# Patient Record
Sex: Female | Born: 1990 | Race: White | Hispanic: No | Marital: Married | Smoking: Never smoker
Health system: Southern US, Community
[De-identification: ages and names within clinical notes are randomized; demographics above are authoritative.]

## PROBLEM LIST (undated history)

## (undated) ENCOUNTER — Ambulatory Visit: Admission: EM | Disposition: A | Discharge status: Home / Self Care | Payer: 59

## (undated) DIAGNOSIS — F419 Anxiety disorder, unspecified: Secondary | ICD-10-CM

## (undated) DIAGNOSIS — R002 Palpitations: Secondary | ICD-10-CM

## (undated) HISTORY — DX: Anxiety disorder, unspecified: F41.9

## (undated) HISTORY — PX: TONSILLECTOMY: SUR1361

## (undated) HISTORY — DX: Palpitations: R00.2

## (undated) HISTORY — PX: BREAST SURGERY: SHX581

---

## 2010-04-05 DIAGNOSIS — I4949 Other premature depolarization: Secondary | ICD-10-CM | POA: Insufficient documentation

## 2018-11-05 ENCOUNTER — Other Ambulatory Visit (HOSPITAL_COMMUNITY)
Admission: RE | Admit: 2018-11-05 | Discharge: 2018-11-05 | Disposition: A | Discharge status: Home / Self Care | Payer: 59 | Source: Ambulatory Visit | Attending: Obstetrics and Gynecology | Admitting: Obstetrics and Gynecology

## 2018-11-05 ENCOUNTER — Ambulatory Visit (INDEPENDENT_AMBULATORY_CARE_PROVIDER_SITE_OTHER): Payer: Self-pay | Admitting: Obstetrics and Gynecology

## 2018-11-05 ENCOUNTER — Other Ambulatory Visit: Payer: Self-pay

## 2018-11-05 ENCOUNTER — Encounter: Payer: Self-pay | Admitting: Obstetrics and Gynecology

## 2018-11-05 VITALS — BP 125/71 | HR 65 | Ht 64.0 in | Wt 187.6 lb

## 2018-11-05 DIAGNOSIS — Z124 Encounter for screening for malignant neoplasm of cervix: Secondary | ICD-10-CM

## 2018-11-05 DIAGNOSIS — Z01419 Encounter for gynecological examination (general) (routine) without abnormal findings: Secondary | ICD-10-CM

## 2018-11-05 NOTE — Addendum Note (Signed)
Addended by: Durwin Glaze on: 11/05/2018 01:59 PM   Modules accepted: Orders

## 2018-11-05 NOTE — Progress Notes (Signed)
Patient comes in today as a new patient. She is here for a physical.

## 2018-11-05 NOTE — Progress Notes (Signed)
HPI:      [name] is a 28 y.o. G0P0000 who LMP was Patient's last menstrual period was 10/21/2018.  Subjective:   She presents today for her annual examination.  She has no complaints.  She uses Nexplanon for birth control.  She has occasional menses. She is considering pregnancy next year and would like her Nexplanon removed in December.  This is her third Nexplanon.    Hx: The following portions of the patient's history were reviewed and updated as appropriate:             She  has a past medical history of Anxiety. She does not have a problem list on file. She  has a past surgical history that includes Tonsillectomy and Breast surgery. Her family history includes COPD in her maternal grandmother; Heart disease in her mother; Heart failure in her father. She  reports that she has never smoked. She has never used smokeless tobacco. She reports that she does not use drugs. No history on file for alcohol. She currently has no medications in their medication list. She is allergic to azithromycin and penicillins.       Review of Systems:  Review of Systems  Constitutional: Denied constitutional symptoms, night sweats, recent illness, fatigue, fever, insomnia and weight loss.  Eyes: Denied eye symptoms, eye pain, photophobia, vision change and visual disturbance.  Ears/Nose/Throat/Neck: Denied ear, nose, throat or neck symptoms, hearing loss, nasal discharge, sinus congestion and sore throat.  Cardiovascular: Denied cardiovascular symptoms, arrhythmia, chest pain/pressure, edema, exercise intolerance, orthopnea and palpitations.  Respiratory: Denied pulmonary symptoms, asthma, pleuritic pain, productive sputum, cough, dyspnea and wheezing.  Gastrointestinal: Denied, gastro-esophageal reflux, melena, nausea and vomiting.  Genitourinary: Denied genitourinary symptoms including symptomatic vaginal discharge, pelvic relaxation issues, and urinary complaints.  Musculoskeletal: Denied  musculoskeletal symptoms, stiffness, swelling, muscle weakness and myalgia.  Dermatologic: Denied dermatology symptoms, rash and scar.  Neurologic: Denied neurology symptoms, dizziness, headache, neck pain and syncope.  Psychiatric: Denied psychiatric symptoms, anxiety and depression.  Endocrine: Denied endocrine symptoms including hot flashes and night sweats.   Meds:   No current outpatient medications on file prior to visit.   No current facility-administered medications on file prior to visit.     Objective:     Vitals:   11/05/18 1327  BP: 125/71  Pulse: 65              Physical examination General NAD, Conversant  HEENT Atraumatic; Op clear with mmm.  Normo-cephalic. Pupils reactive. Anicteric sclerae  Thyroid/Neck Smooth without nodularity or enlargement. Normal ROM.  Neck Supple.  Skin No rashes, lesions or ulceration. Normal palpated skin turgor. No nodularity.  Breasts: No masses or discharge.  Symmetric.  No axillary adenopathy.  Lungs: Clear to auscultation.No rales or wheezes. Normal Respiratory effort, no retractions.  Heart: NSR.  No murmurs or rubs appreciated. No periferal edema  Abdomen: Soft.  Non-tender.  No masses.  No HSM. No hernia  Extremities: Moves all appropriately.  Normal ROM for age. No lymphadenopathy.  Neuro: Oriented to PPT.  Normal mood. Normal affect.     Pelvic:   Vulva: Normal appearance.  No lesions.  Vagina: No lesions or abnormalities noted.  Support: Normal pelvic support.  Urethra No masses tenderness or scarring.  Meatus Normal size without lesions or prolapse.  Cervix: Normal appearance.  No lesions.  Anus: Normal exam.  No lesions.  Perineum: Normal exam.  No lesions.        Bimanual   Uterus:  Normal size.  Non-tender.  Mobile.  AV.  Adnexae: No masses.  Non-tender to palpation.  Cul-de-sac: Negative for abnormality.      Assessment:    G0P0000 There are no active problems to display for this patient.    1. Well  woman exam with routine gynecological exam     No problems.   Plan:            1.  Basic Screening Recommendations The basic screening recommendations for asymptomatic women were discussed with the patient during her visit.  The age-appropriate recommendations were discussed with her and the rational for the tests reviewed.  When I am informed by the patient that another primary care physician has previously obtained the age-appropriate tests and they are up-to-date, only outstanding tests are ordered and referrals given as necessary.  Abnormal results of tests will be discussed with her when all of her results are completed. Pap performed 2.  Follow-up in December for Nexplanon removal Orders No orders of the defined types were placed in this encounter.   No orders of the defined types were placed in this encounter.       F/U  Return in about 1 year (around 11/05/2019) for Annual Physical.  Finis Bud, M.D. 11/05/2018 1:53 PM

## 2018-11-07 LAB — CYTOLOGY - PAP: Diagnosis: NEGATIVE

## 2018-12-26 ENCOUNTER — Telehealth: Payer: Self-pay | Admitting: General Practice

## 2018-12-26 NOTE — Telephone Encounter (Signed)
Patient is calling to establish care and get a TB- for school surgical technology [phone]

## 2018-12-30 ENCOUNTER — Other Ambulatory Visit: Payer: Self-pay

## 2018-12-30 ENCOUNTER — Encounter: Payer: Self-pay | Admitting: Primary Care

## 2018-12-30 ENCOUNTER — Ambulatory Visit (INDEPENDENT_AMBULATORY_CARE_PROVIDER_SITE_OTHER): Payer: 59 | Admitting: Primary Care

## 2018-12-30 VITALS — BP 122/82 | HR 61 | Temp 98.3°F | Ht 64.0 in | Wt 189.5 lb

## 2018-12-30 DIAGNOSIS — Z111 Encounter for screening for respiratory tuberculosis: Secondary | ICD-10-CM | POA: Diagnosis not present

## 2018-12-30 DIAGNOSIS — R002 Palpitations: Secondary | ICD-10-CM

## 2018-12-30 DIAGNOSIS — F411 Generalized anxiety disorder: Secondary | ICD-10-CM | POA: Insufficient documentation

## 2018-12-30 NOTE — Addendum Note (Signed)
Addended by: Jacqualin Combes on: 12/30/2018 08:40 AM   Modules accepted: Orders

## 2018-12-30 NOTE — Assessment & Plan Note (Signed)
Chronic for years, once on Zoloft but has been off for several years. Doing well overall. Continue to monitor.

## 2018-12-30 NOTE — Progress Notes (Signed)
Subjective:    Patient ID: Veronica Wagner, female    DOB: 08/19/1990, 28 y.o.   MRN: [id number]  HPI  Veronica Wagner is a 28 year old female who presents today to establish care and discuss the problems mentioned below. Will obtain/review records.  1) Anxiety: Chronic, diagnosed around 2015. Once managed on sertraline, weaned herself off of her medication in 2018. Currently she's managing her anxiety with exercise and other self calming techniques.   2) Palpitations: Chronic. Evaluated by cardiology, has worn three different heart monitors without any obvious cause. Palpitations occur mostly when driving, several times weekly.  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 She is needing a TB skin test today. She denies cough, night sweats, unexplained weight loss, hemoptysis.   Review of Systems  Constitutional: Negative for fatigue and unexpected weight change.  Respiratory: Negative for shortness of breath.        Denies hemoptysis   Cardiovascular: Negative for chest pain.  Neurological: Negative for headaches.       Past Medical History:  Diagnosis Date  . Anxiety    Not on medication  . Palpitations      Social History   Socioeconomic History  . Marital status: Married    Spouse name: Not on file  . Number of children: Not on file  . Years of education: Not on file  . Highest education level: Not on file  Occupational History  . Not on file  Social Needs  . Financial resource strain: Not on file  . Food insecurity    Worry: Not on file    Inability: Not on file  . Transportation needs    Medical: Not on file    Non-medical: Not on file  Tobacco Use  . Smoking status: Never Smoker  . Smokeless tobacco: Never Used  Substance and Sexual Activity  . Alcohol use: Not on file    Comment: social   . Drug use: Never  . Sexual activity: Yes    Birth control/protection: Implant  Lifestyle  . Physical activity    Days per  week: Not on file    Minutes per session: Not on file  . Stress: Not on file  Relationships  . Social Musicianconnections    Talks on phone: Not on file    Gets together: Not on file    Attends religious service: Not on file    Active member of club or organization: Not on file    Attends meetings of clubs or organizations: Not on file    Relationship status: Not on file  . Intimate partner violence    Fear of current or ex partner: Not on file    Emotionally abused: Not on file    Physically abused: Not on file    Forced sexual activity: Not on file  Other Topics Concern  . Not on file  Social History Narrative   Married.   Student at [employer].    Past Surgical History:  Procedure Laterality Date  . BREAST SURGERY     Left breast aspiration.   . TONSILLECTOMY      Family History  Problem Relation Age of Onset  . Heart disease Mother   . Heart attack Father 1545  . Early death Father   . COPD Maternal Grandmother   . Dementia Maternal Grandfather     Allergies  Allergen Reactions  . Azithromycin Hives and Rash  . Penicillins Hives  No current outpatient medications on file prior to visit.   No current facility-administered medications on file prior to visit.     BP 122/82   Pulse 61   Temp 98.3 F (36.8 C) (Temporal)   Ht 5\' 4"  (1.626 m)   Wt 189 lb 8 oz (86 kg)   LMP 12/01/2018 (LMP Unknown)   SpO2 98%   BMI 32.53 kg/m    Objective:   Physical Exam  Constitutional: She appears well-nourished.  Neck: Neck supple.  Cardiovascular: Normal rate and regular rhythm.  Respiratory: Effort normal and breath sounds normal.  Skin: Skin is warm and dry.  Psychiatric: She has a normal mood and affect.           Assessment & Plan:

## 2018-12-30 NOTE — Patient Instructions (Signed)
Return for your TB skin test read as instructed.   It was a pleasure to meet you today! Please don't hesitate to call or message me with any questions. Welcome to [facility]!

## 2018-12-30 NOTE — Assessment & Plan Note (Signed)
Intermittent, chronic. Followed with cardiology several times. Notes in Care Everywhere.

## 2019-01-01 ENCOUNTER — Encounter: Payer: Self-pay | Admitting: *Deleted

## 2019-01-01 LAB — TB SKIN TEST
Induration: 0 mm
TB Skin Test: NEGATIVE

## 2019-03-03 ENCOUNTER — Other Ambulatory Visit
Admission: RE | Admit: 2019-03-03 | Discharge: 2019-03-03 | Disposition: A | Discharge status: Home / Self Care | Payer: 59 | Source: Ambulatory Visit | Attending: Family Medicine | Admitting: Family Medicine

## 2019-03-03 DIAGNOSIS — R079 Chest pain, unspecified: Secondary | ICD-10-CM | POA: Insufficient documentation

## 2019-03-03 LAB — FIBRIN DERIVATIVES D-DIMER (ARMC ONLY): Fibrin derivatives D-dimer (ARMC): 164.31 ng/mL (FEU) (ref 0.00–499.00)

## 2019-05-16 ENCOUNTER — Encounter: Payer: Self-pay | Admitting: Obstetrics and Gynecology

## 2019-05-20 ENCOUNTER — Ambulatory Visit (INDEPENDENT_AMBULATORY_CARE_PROVIDER_SITE_OTHER): Payer: 59 | Admitting: Obstetrics and Gynecology

## 2019-05-20 ENCOUNTER — Encounter: Payer: Self-pay | Admitting: Obstetrics and Gynecology

## 2019-05-20 ENCOUNTER — Other Ambulatory Visit: Payer: Self-pay

## 2019-05-20 VITALS — BP 148/89 | HR 82 | Ht 64.0 in | Wt 186.2 lb

## 2019-05-20 DIAGNOSIS — Z3046 Encounter for surveillance of implantable subdermal contraceptive: Secondary | ICD-10-CM | POA: Diagnosis not present

## 2019-05-20 NOTE — Progress Notes (Signed)
HPI:      [name] is a 28 y.o. G0P0000 who LMP was No LMP recorded. Patient has had an implant.  Subjective:   She presents today for removal of her Nexplanon.  This is her third Nexplanon and she has liked them.  She would like it removed and has been planning this for more than 6 months.  She is considering pregnancy in the near future and thus does not desire birth control at this time.    Hx: The following portions of the patient's history were reviewed and updated as appropriate:             She  has a past medical history of Anxiety and Palpitations. She does not have any pertinent problems on file. She  has a past surgical history that includes Tonsillectomy and Breast surgery. Her family history includes COPD in her maternal grandmother; Dementia in her maternal grandfather; Early death in her father; Heart attack (age of onset: 52) in her father; Heart disease in her mother. She  reports that she has never smoked. She has never used smokeless tobacco. She reports that she does not use drugs. No history on file for alcohol. She currently has no medications in their medication list. She is allergic to azithromycin and penicillins.       Review of Systems:  Review of Systems  Constitutional: Denied constitutional symptoms, night sweats, recent illness, fatigue, fever, insomnia and weight loss.  Eyes: Denied eye symptoms, eye pain, photophobia, vision change and visual disturbance.  Ears/Nose/Throat/Neck: Denied ear, nose, throat or neck symptoms, hearing loss, nasal discharge, sinus congestion and sore throat.  Cardiovascular: Denied cardiovascular symptoms, arrhythmia, chest pain/pressure, edema, exercise intolerance, orthopnea and palpitations.  Respiratory: Denied pulmonary symptoms, asthma, pleuritic pain, productive sputum, cough, dyspnea and wheezing.  Gastrointestinal: Denied, gastro-esophageal reflux, melena, nausea and vomiting.  Genitourinary: Denied genitourinary  symptoms including symptomatic vaginal discharge, pelvic relaxation issues, and urinary complaints.  Musculoskeletal: Denied musculoskeletal symptoms, stiffness, swelling, muscle weakness and myalgia.  Dermatologic: Denied dermatology symptoms, rash and scar.  Neurologic: Denied neurology symptoms, dizziness, headache, neck pain and syncope.  Psychiatric: Denied psychiatric symptoms, anxiety and depression.  Endocrine: Denied endocrine symptoms including hot flashes and night sweats.   Meds:   No current outpatient medications on file prior to visit.   No current facility-administered medications on file prior to visit.    Objective:     Vitals:   05/20/19 1340  BP: (!) 148/89  Pulse: 82              Implanon removal Procedure note - The Implanon was noted in the patient's arm and the end was identified. The skin was cleansed with a Betadine solution. A small injection of subcutaneous lidocaine with epinephrine was given. over the end of the implant. An incision was made at the end of the implant. The rod was noted in the incision and grasp with a hemostat. The capsule was nicked and the rod was again grasped and removed. It was noted to be intact.  The patient's skin was again cleansed.  Steri-Strips were placed approximating the incision. Hemostasis was noted.  Brennan Bailey ,MD 05/20/2019,2:11 PM   Assessment:    G0P0000 Patient Active Problem List   Diagnosis Date Noted  . GAD (generalized anxiety disorder) 12/30/2018  . Palpitations 12/30/2018     1. Nexplanon removal        Plan:            1.  Wound care discussed  2.  Patient to follow-up when pregnant or for annual examination. Orders No orders of the defined types were placed in this encounter.   No orders of the defined types were placed in this encounter.     F/U  Return for Annual Physical.  Finis Bud, M.D. 05/20/2019 2:11 PM

## 2019-06-10 ENCOUNTER — Other Ambulatory Visit: Payer: Self-pay

## 2019-06-10 ENCOUNTER — Encounter: Payer: Self-pay | Admitting: Primary Care

## 2019-06-10 ENCOUNTER — Ambulatory Visit (INDEPENDENT_AMBULATORY_CARE_PROVIDER_SITE_OTHER): Payer: 59 | Admitting: Primary Care

## 2019-06-10 VITALS — BP 126/80 | HR 87 | Temp 97.0°F | Ht 64.0 in | Wt 187.0 lb

## 2019-06-10 DIAGNOSIS — F411 Generalized anxiety disorder: Secondary | ICD-10-CM

## 2019-06-10 MED ORDER — SERTRALINE HCL 50 MG PO TABS: 50.0000 mg | ORAL_TABLET | Freq: Every day | ORAL | 1 refills | Status: DC

## 2019-06-10 NOTE — Patient Instructions (Signed)
Start sertraline 50 mg tablets for anxiety. Start by taking 1/2 tablet once daily for 2 weeks then increase to 1 full tablet thereafter.  Please schedule a follow up visit for 6 weeks as discussed.   It was a pleasure to see you today!

## 2019-06-10 NOTE — Assessment & Plan Note (Signed)
Chronic and intermittent for years, worse over the last month. Discussed options for treatment including therapy and/or medication treatment, she opts for both.  Referral placed for therapy. Rx for Zoloft 50 mg sent to pharmacy.   Patient is to take 1/2 tablet daily for two weeks days, then advance to 1 full tablet thereafter. We discussed possible side effects of headache, GI upset, drowsiness, and SI/HI. If thoughts of SI/HI develop, we discussed to present to the emergency immediately. Patient verbalized understanding.   Follow up in 6 weeks for re-evaluation.

## 2019-06-10 NOTE — Addendum Note (Signed)
Addended by: Doreene Nest on: 06/10/2019 02:23 PM   Modules accepted: Orders

## 2019-06-10 NOTE — Progress Notes (Signed)
Subjective:    Patient ID: Veronica Wagner, female    DOB: 16-Mar-1991, 29 y.o.   MRN: [id number]  HPI  This visit occurred during the SARS-CoV-2 public health emergency.  Safety protocols were in place, including screening questions prior to the visit, additional usage of staff PPE, and extensive cleaning of exam room while observing appropriate contact time as indicated for disinfecting solutions.   Veronica Wagner is a 29 year old female with a history of anxiety disorder who presents today to discuss anxiety.   She was once managed on Zoloft in the past for anxiety, no use in years as she did well overall. She's been off of her OCP's since mid December 2020 and anxiety has increased. 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 She feels that a lot of her symptoms are secondary to family issues for which she's not yet been able to deal with.   She denies depression, feeling sad/down. GAD 7 score of 20 today.  Review of Systems  Respiratory: Negative for shortness of breath.   Cardiovascular: Positive for palpitations. Negative for chest pain.  Psychiatric/Behavioral: Positive for sleep disturbance. The patient is nervous/anxious.        See HPI       Past Medical History:  Diagnosis Date  . Anxiety    Not on medication  . Palpitations      Social History   Socioeconomic History  . Marital status: Married    Spouse name: Not on file  . Number of children: Not on file  . Years of education: Not on file  . Highest education level: Not on file  Occupational History  . Not on file  Tobacco Use  . Smoking status: Never Smoker  . Smokeless tobacco: Never Used  Substance and Sexual Activity  . Alcohol use: Not on file    Comment: social   . Drug use: Never  . Sexual activity: Yes    Birth control/protection: Implant  Other Topics Concern  . Not on file    Social History Narrative   Married.   Student at [employer].   Social Determinants of Health   Financial Resource Strain:   . Difficulty of Paying Living Expenses: Not on file  Food Insecurity:   . Worried About Charity fundraiser in the Last Year: Not on file  . Ran Out of Food in the Last Year: Not on file  Transportation Needs:   . Lack of Transportation (Medical): Not on file  . Lack of Transportation (Non-Medical): Not on file  Physical Activity:   . Days of Exercise per Week: Not on file  . Minutes of Exercise per Session: Not on file  Stress:   . Feeling of Stress : Not on file  Social Connections:   . Frequency of Communication with Friends and Family: Not on file  . Frequency of Social Gatherings with Friends and Family: Not on file  . Attends Religious Services: Not on file  . Active Member of Clubs or Organizations: Not on file  . Attends Archivist Meetings: Not on file  . Marital Status: Not on file  Intimate Partner Violence:   . Fear of Current or Ex-Partner: Not on file  . Emotionally Abused: Not on file  . Physically Abused: Not on file  . Sexually Abused: Not on file    Past Surgical  History:  Procedure Laterality Date  . BREAST SURGERY     Left breast aspiration.   . TONSILLECTOMY      Family History  Problem Relation Age of Onset  . Heart disease Mother   . Heart attack Father 56  . Early death Father   . COPD Maternal Grandmother   . Dementia Maternal Grandfather     Allergies  Allergen Reactions  . Azithromycin Hives and Rash  . Penicillins Hives    No current outpatient medications on file prior to visit.   No current facility-administered medications on file prior to visit.    BP 126/80   Pulse 87   Temp (!) 97 F (36.1 C) (Temporal)   Ht 5\' 4"  (1.626 m)   Wt 187 lb (84.8 kg)   SpO2 100%   BMI 32.10 kg/m    Objective:   Physical Exam  Constitutional: She appears well-nourished.   Cardiovascular: Normal rate and regular rhythm.  Respiratory: Effort normal and breath sounds normal.  Musculoskeletal:     Cervical back: Neck supple.  Skin: Skin is warm and dry.  Psychiatric: She has a normal mood and affect.           Assessment & Plan:

## 2019-07-02 ENCOUNTER — Other Ambulatory Visit: Payer: Self-pay | Admitting: Primary Care

## 2019-07-02 DIAGNOSIS — F411 Generalized anxiety disorder: Secondary | ICD-10-CM

## 2019-07-23 ENCOUNTER — Ambulatory Visit (INDEPENDENT_AMBULATORY_CARE_PROVIDER_SITE_OTHER): Payer: 59 | Admitting: Primary Care

## 2019-07-23 ENCOUNTER — Encounter: Payer: Self-pay | Admitting: Primary Care

## 2019-07-23 ENCOUNTER — Other Ambulatory Visit: Payer: Self-pay

## 2019-07-23 DIAGNOSIS — G4700 Insomnia, unspecified: Secondary | ICD-10-CM | POA: Diagnosis not present

## 2019-07-23 DIAGNOSIS — F411 Generalized anxiety disorder: Secondary | ICD-10-CM | POA: Diagnosis not present

## 2019-07-23 MED ORDER — TRAZODONE HCL 50 MG PO TABS: 25.0000 mg | ORAL_TABLET | Freq: Every evening | ORAL | 0 refills | Status: DC | PRN

## 2019-07-23 NOTE — Progress Notes (Signed)
Subjective:    Patient ID: Veronica Wagner, female    DOB: 08-13-1990, 29 y.o.   MRN: [id number]  HPI  This visit occurred during the SARS-CoV-2 public health emergency.  Safety protocols were in place, including screening questions prior to the visit, additional usage of staff PPE, and extensive cleaning of exam room while observing appropriate contact time as indicated for disinfecting solutions.   [name] is a 29 year old female who presents today for follow up of anxiety.  She was last evaluated in mid January 2021 with symptoms of anxiety including feeling nervous, daily worry, mind racing thoughts, palpitations. GAD 7 score of 20. Had done well on Zoloft years prior, wanted to resume. Zoloft 50 mg sent to pharmacy and referral placed to therapy.  Since her last visit she's doing better. Positive effects include feeling more calm, feeling less on "edge". She continues to struggle with difficulty falling asleep, sometimes with mind racing thoughts, will wake at 3 am but will quickly fall back asleep. She's been taking Ibuprofen PM with improvement. Also struggles with daily worry. She's tried Melatonin in the past which was ineffective. She had vivid nightmares with Unisom.   She has yet to meet with therapy as she cannot find the right fit. Her most bothersome symptom is difficulty sleeping. She denies SI/HI.   BP Readings from Last 3 Encounters:  07/23/19 124/80  06/10/19 126/80  05/20/19 (!) 148/89     Review of Systems  Gastrointestinal: Negative for nausea.  Neurological: Negative for headaches.  Psychiatric/Behavioral: Positive for sleep disturbance. Negative for suicidal ideas. The patient is nervous/anxious.        Past Medical History:  Diagnosis Date  . Anxiety    Not on medication  . Palpitations      Social History   Socioeconomic History  . Marital status: Married    Spouse name: Not on file  . Number of children: Not on file  . Years of education: Not on  file  . Highest education level: Not on file  Occupational History  . Not on file  Tobacco Use  . Smoking status: Never Smoker  . Smokeless tobacco: Never Used  Substance and Sexual Activity  . Alcohol use: Not on file    Comment: social   . Drug use: Never  . Sexual activity: Yes    Birth control/protection: Implant  Other Topics Concern  . Not on file  Social History Narrative   Married.   Student at [employer].   Social Determinants of Health   Financial Resource Strain:   . Difficulty of Paying Living Expenses: Not on file  Food Insecurity:   . Worried About Charity fundraiser in the Last Year: Not on file  . Ran Out of Food in the Last Year: Not on file  Transportation Needs:   . Lack of Transportation (Medical): Not on file  . Lack of Transportation (Non-Medical): Not on file  Physical Activity:   . Days of Exercise per Week: Not on file  . Minutes of Exercise per Session: Not on file  Stress:   . Feeling of Stress : Not on file  Social Connections:   . Frequency of Communication with Friends and Family: Not on file  . Frequency of Social Gatherings with Friends and Family: Not on file  . Attends Religious Services: Not on file  . Active Member of Clubs or Organizations: Not on file  . Attends Archivist Meetings: Not on  file  . Marital Status: Not on file  Intimate Partner Violence:   . Fear of Current or Ex-Partner: Not on file  . Emotionally Abused: Not on file  . Physically Abused: Not on file  . Sexually Abused: Not on file    Past Surgical History:  Procedure Laterality Date  . BREAST SURGERY     Left breast aspiration.   . TONSILLECTOMY      Family History  Problem Relation Age of Onset  . Heart disease Mother   . Heart attack Father 38  . Early death Father   . COPD Maternal Grandmother   . Dementia Maternal Grandfather     Allergies  Allergen Reactions  . Azithromycin Hives and Rash  . Penicillins Hives     Current Outpatient Medications on File Prior to Visit  Medication Sig Dispense Refill  . sertraline (ZOLOFT) 50 MG tablet TAKE 1 TABLET (50 MG TOTAL) BY MOUTH DAILY. FOR ANXIETY. 90 tablet 0   No current facility-administered medications on file prior to visit.    BP 124/80   Pulse 82   Temp (!) 97 F (36.1 C) (Temporal)   Ht 5\' 4"  (1.626 m)   Wt 184 lb 8 oz (83.7 kg)   LMP 07/07/2019   SpO2 98%   BMI 31.67 kg/m    Objective:   Physical Exam  Constitutional: She appears well-nourished.  Cardiovascular: Normal rate and regular rhythm.  Respiratory: Effort normal and breath sounds normal.  Musculoskeletal:     Cervical back: Neck supple.  Skin: Skin is warm and dry.  Psychiatric: She has a normal mood and affect.           Assessment & Plan:

## 2019-07-23 NOTE — Patient Instructions (Signed)
Start trazodone 50 mg for sleep. Take 1/2 to 1 tablet by mouth 30 minutes prior to sleep. Please update me in one week.  Continue Zoloft 50 mg daily for anxiety.  It was a pleasure to see you today!

## 2019-07-23 NOTE — Assessment & Plan Note (Signed)
Improved on Zoloft 50 mg, do suspect she would improve with increased dose but she kindly declines.  Will treat insomnia with Trazodone. She will update. Consider Zoloft increase to 100 mg.

## 2019-07-23 NOTE — Assessment & Plan Note (Signed)
Chronic for years, also with chronic anxiety. Overall doing better on Zoloft 50 mg for anxiety.  Offered to increase Zoloft for better anxiety control, she would like to try medication for sleep.  Rx for Trazodone sent to pharmacy. She will update in one week.

## 2019-08-14 ENCOUNTER — Other Ambulatory Visit: Payer: Self-pay | Admitting: Primary Care

## 2019-08-14 DIAGNOSIS — G4700 Insomnia, unspecified: Secondary | ICD-10-CM

## 2019-08-15 ENCOUNTER — Other Ambulatory Visit: Payer: Self-pay

## 2019-08-15 DIAGNOSIS — G4700 Insomnia, unspecified: Secondary | ICD-10-CM

## 2019-08-15 MED ORDER — TRAZODONE HCL 50 MG PO TABS: 25.0000 mg | ORAL_TABLET | Freq: Every evening | ORAL | 0 refills | Status: DC | PRN

## 2019-09-13 ENCOUNTER — Other Ambulatory Visit: Payer: Self-pay | Admitting: Family Medicine

## 2019-09-13 DIAGNOSIS — G4700 Insomnia, unspecified: Secondary | ICD-10-CM

## 2019-09-15 NOTE — Telephone Encounter (Signed)
Last prescribed on 08/15/2019 by Dr Ermalene Searing, not sure why. Last appointment on 07/23/2019. No future appointment

## 2019-10-02 ENCOUNTER — Other Ambulatory Visit: Payer: Self-pay | Admitting: Primary Care

## 2019-10-02 DIAGNOSIS — F411 Generalized anxiety disorder: Secondary | ICD-10-CM

## 2019-11-05 ENCOUNTER — Ambulatory Visit (INDEPENDENT_AMBULATORY_CARE_PROVIDER_SITE_OTHER): Payer: 59 | Admitting: Family Medicine

## 2019-11-05 ENCOUNTER — Other Ambulatory Visit: Payer: Self-pay

## 2019-11-05 ENCOUNTER — Encounter: Payer: Self-pay | Admitting: Family Medicine

## 2019-11-05 VITALS — BP 100/60 | HR 82 | Temp 98.4°F | Ht 64.0 in | Wt 197.0 lb

## 2019-11-05 DIAGNOSIS — M6752 Plica syndrome, left knee: Secondary | ICD-10-CM | POA: Diagnosis not present

## 2019-11-05 NOTE — Patient Instructions (Signed)
PLICA BAND or PLICA on the knee  Band of tissue / Kink in the synovial lining. The joint lining.  Use your fingers a few times a day for the next few weeks to massage that area to help break down the tissue. You can also use an ice cube after work.   Volaren 1% gel. Over the counter You can apply up to 4 times a day Minimal is absorbed in the bloodstream Cost is about 9 dollars

## 2019-11-05 NOTE — Progress Notes (Signed)
Veronica Bells T. Veronica Khatib, MD, CAQ Sports Medicine  Primary Care and Sports Medicine [facility] [address] [place], [address]  Phone: [phone]  FAX: [phone]  Veronica Wagner - 29 y.o. female  MRN [id number]  Date of Birth: [birth year]  Date: 11/05/2019  PCP: Doreene Nest, NP  Referral: Doreene Nest, NP  Chief Complaint  Patient presents with  . Knee Pain    Grinding and Popping-Left    This visit occurred during the SARS-CoV-2 public health emergency.  Safety protocols were in place, including screening questions prior to the visit, additional usage of staff PPE, and extensive cleaning of exam room while observing appropriate contact time as indicated for disinfecting solutions.   Subjective:   Veronica Wagner is a 29 y.o. very pleasant female patient with Body mass index is 33.81 kg/m. who presents with the following:  L knee feels like her knee is grinding.  She is having some pain in the anterior knee.  It is worse when getting up from a seated position and going downstairs.  She is very active at work, and she is having some difficulty with squatting and having pain.  She has been elevating her leg, icing her knee, but oral analgesics have not provided any significant pain.  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   3-4 times a week she will work out with some interval workouts such as CrossFit type workouts.  Review of Systems is noted in the HPI, as appropriate   Objective:   BP 100/60   Pulse 82   Temp 98.4 F (36.9 C) (Temporal)   Ht 5\' 4"  (1.626 m)   Wt 197 lb (89.4 kg)   LMP 10/20/2019   SpO2 98%   BMI 33.81 kg/m    GEN: No acute distress; alert,appropriate. PULM: Breathing comfortably in no respiratory distress PSYCH: Normally interactive.    Left knee: Full extension and flexion to 135 degrees.  Stable to varus and valgus stress.  ACL and PCL are intact.  No significant pain with flexion pinch, McMurray's,  bounce home test, or other meniscal testing.  Nontender along the joint lines.  Minimal to no patellar crepitus with motion.  On the medial aspect of the knee just inferior to the joint line she does have a painful plica band and she notes this is the tenderness that she is feeling.  Radiology:   Assessment and Plan:     ICD-10-CM   1. 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  Painful plica band ID'd on exam Massage for 8 weeks with hand multiple times a day and with an ice cube  If fails, would do a plica injection along band of tissue.  Ultimately, if all treatment fails, surgical consultation for definitive management and possible synovectomy could be considered.   Patient Instructions  PLICA BAND or PLICA on the knee  Band of tissue / Kink in the synovial lining. The joint lining.  Use your fingers a few times a day for the next few weeks to massage that area to help break down the tissue. You can also use an ice cube after work.   Volaren 1% gel. Over the counter You can apply up to 4 times a day Minimal is absorbed in the bloodstream Cost is about 9 dollars     Follow-up: No follow-ups on file.  No orders of the defined types were placed in this encounter.  There are no discontinued medications. No orders of the defined types were placed in this encounter.   Signed,  Maud Deed. Saburo Luger, MD   Outpatient Encounter Medications as of 11/05/2019  Medication Sig  . sertraline (ZOLOFT) 50 MG tablet TAKE 1 TABLET (50 MG TOTAL) BY MOUTH DAILY. FOR ANXIETY.  . traZODone (DESYREL) 50 MG tablet TAKE 1/2 TO 1 TABLET BY MOUTH AT BEDTIME AS NEEDED FOR SLEEP   No facility-administered encounter medications on file as of 11/05/2019.

## 2019-11-27 ENCOUNTER — Other Ambulatory Visit: Payer: Self-pay

## 2019-11-27 ENCOUNTER — Ambulatory Visit
Admission: EM | Admit: 2019-11-27 | Discharge: 2019-11-27 | Disposition: A | Discharge status: Home / Self Care | Payer: 59 | Attending: Emergency Medicine | Admitting: Emergency Medicine

## 2019-11-27 DIAGNOSIS — M62838 Other muscle spasm: Secondary | ICD-10-CM

## 2019-11-27 MED ORDER — IBUPROFEN 800 MG PO TABS
800.0000 mg | ORAL_TABLET | Freq: Three times a day (TID) | ORAL | 0 refills | Status: DC | PRN
Start: 2019-11-27 — End: 2020-05-30

## 2019-11-27 MED ORDER — CYCLOBENZAPRINE HCL 10 MG PO TABS
10.0000 mg | ORAL_TABLET | Freq: Two times a day (BID) | ORAL | 0 refills | Status: DC | PRN
Start: 2019-11-27 — End: 2020-05-30

## 2019-11-27 NOTE — ED Provider Notes (Signed)
Renaldo Fiddler    CSN: [id number] Arrival date & time: 11/27/19  1258      History   Chief Complaint Chief Complaint  Patient presents with  . Neck Pain    HPI Veronica Wagner is a 29 y.o. female.   Patient presents with left trapezius muscle spasm since waking up this morning.  No falls or injury.  She attempted treatment with Tylenol with minimal relief.  She denies numbness, paresthesias, weakness in her UE.  She denies redness, bruising, rash, lesions, fever, chills, or other symptoms.    The history is provided by the patient.    Past Medical History:  Diagnosis Date  . Anxiety    Not on medication  . Palpitations     Patient Active Problem List   Diagnosis Date Noted  . Insomnia 07/23/2019  . GAD (generalized anxiety disorder) 12/30/2018  . Palpitations 12/30/2018    Past Surgical History:  Procedure Laterality Date  . BREAST SURGERY     Left breast aspiration.   . TONSILLECTOMY      OB History    Gravida  0   Para  0   Term  0   Preterm  0   AB  0   Living  0     SAB  0   TAB  0   Ectopic  0   Multiple  0   Live Births  0            Home Medications    Prior to Admission medications   Medication Sig Start Date End Date Taking? Authorizing Provider  sertraline (ZOLOFT) 50 MG tablet TAKE 1 TABLET (50 MG TOTAL) BY MOUTH DAILY. FOR ANXIETY. 10/02/19  Yes Doreene Nest, NP  cyclobenzaprine (FLEXERIL) 10 MG tablet Take 1 tablet (10 mg total) by mouth 2 (two) times daily as needed for muscle spasms. 11/27/19   Mickie Bail, NP  ibuprofen (ADVIL) 800 MG tablet Take 1 tablet (800 mg total) by mouth every 8 (eight) hours as needed. 11/27/19   Mickie Bail, NP  traZODone (DESYREL) 50 MG tablet TAKE 1/2 TO 1 TABLET BY MOUTH AT BEDTIME AS NEEDED FOR SLEEP 09/15/19   Lorre Munroe, NP    Family History Family History  Problem Relation Age of Onset  . Heart disease Mother   . Heart attack Father 47  . Early death Father   . COPD  Maternal Grandmother   . Dementia Maternal Grandfather     Social History Social History   Tobacco Use  . Smoking status: Never Smoker  . Smokeless tobacco: Never Used  Vaping Use  . Vaping Use: Never used  Substance Use Topics  . Alcohol use: Yes    Comment: social   . Drug use: Never     Allergies   Azithromycin and Penicillins   Review of Systems Review of Systems  Constitutional: Negative for chills and fever.  HENT: Negative for ear pain and sore throat.   Eyes: Negative for pain and visual disturbance.  Respiratory: Negative for cough and shortness of breath.   Cardiovascular: Negative for chest pain and palpitations.  Gastrointestinal: Negative for abdominal pain and vomiting.  Genitourinary: Negative for dysuria and hematuria.  Musculoskeletal: Positive for myalgias and neck pain. Negative for arthralgias and back pain.  Skin: Negative for color change and rash.  Neurological: Negative for seizures, syncope, weakness and numbness.  All other systems reviewed and are negative.    Physical Exam Triage  Vital Signs ED Triage Vitals  Enc Vitals Group     BP 11/27/19 1306 (!) 149/87     Pulse Rate 11/27/19 1306 76     Resp 11/27/19 1306 14     Temp 11/27/19 1306 (!) 97.5 F (36.4 C)     Temp src --      SpO2 11/27/19 1306 97 %     Weight --      Height --      Head Circumference --      Peak Flow --      Pain Score 11/27/19 1301 7     Pain Loc --      Pain Edu? --      Excl. in GC? --    No data found.  Updated Vital Signs BP (!) 149/87   Pulse 76   Temp (!) 97.5 F (36.4 C)   Resp 14   LMP 11/13/2019 (Within Days)   SpO2 97%   Visual Acuity Right Eye Distance:   Left Eye Distance:   Bilateral Distance:    Right Eye Near:   Left Eye Near:    Bilateral Near:     Physical Exam Vitals and nursing note reviewed.  Constitutional:      General: She is not in acute distress.    Appearance: She is well-developed.  HENT:     Head:  Normocephalic and atraumatic.     Mouth/Throat:     Mouth: Mucous membranes are moist.  Eyes:     Conjunctiva/sclera: Conjunctivae normal.  Cardiovascular:     Rate and Rhythm: Normal rate and regular rhythm.     Heart sounds: No murmur heard.   Pulmonary:     Effort: Pulmonary effort is normal. No respiratory distress.     Breath sounds: Normal breath sounds.  Abdominal:     Palpations: Abdomen is soft.     Tenderness: There is no abdominal tenderness.  Musculoskeletal:        General: Tenderness present. No swelling, deformity or signs of injury.     Cervical back: Neck supple.     Comments: Left trapezius mildly tender to palpations.  Neck ROM limited due to discomfort.  No rash, lesions, erythema, ecchymosis.   Skin:    General: Skin is warm and dry.     Findings: No bruising, erythema, lesion or rash.  Neurological:     Mental Status: She is alert.     Sensory: No sensory deficit.     Motor: No weakness.     Gait: Gait normal.  Psychiatric:        Mood and Affect: Mood normal.        Behavior: Behavior normal.      UC Treatments / Results  Labs (all labs ordered are listed, but only abnormal results are displayed) Labs Reviewed - No data to display  EKG   Radiology No results found.  Procedures Procedures (including critical care time)  Medications Ordered in UC Medications - No data to display  Initial Impression / Assessment and Plan / UC Course  I have reviewed the triage vital signs and the nursing notes.  Pertinent labs & imaging results that were available during my care of the patient were reviewed by me and considered in my medical decision making (see chart for details).   Trapezius muscle spasm, left.  Treating with ibuprofen and Flexeril.  Precautions for Flexeril discussed with patient.  Instructed her to follow-up with her PCP if her symptoms are  not improving.  Patient agrees to plan of care.     Final Clinical Impressions(s) / UC  Diagnoses   Final diagnoses:  Trapezius muscle spasm     Discharge Instructions     Take the prescribed ibuprofen as needed for your pain.  Take the muscle relaxer Flexeril as needed for muscle spasm; do not drive, operate machinery, or drink alcohol with this medication as it may make you drowsy.    Follow up with your primary care provider if your symptoms are not improving.        ED Prescriptions    Medication Sig Dispense Auth. Provider   ibuprofen (ADVIL) 800 MG tablet Take 1 tablet (800 mg total) by mouth every 8 (eight) hours as needed. 21 tablet Mickie Bail, NP   cyclobenzaprine (FLEXERIL) 10 MG tablet Take 1 tablet (10 mg total) by mouth 2 (two) times daily as needed for muscle spasms. 20 tablet Mickie Bail, NP     I have reviewed the PDMP during this encounter.   Mickie Bail, NP 11/27/19 1329

## 2019-11-27 NOTE — Discharge Instructions (Signed)
Take the prescribed ibuprofen as needed for your pain.  Take the muscle relaxer Flexeril as needed for muscle spasm; do not drive, operate machinery, or drink alcohol with this medication as it may make you drowsy.    Follow up with your primary care provider if your symptoms are not improving.     

## 2019-11-27 NOTE — ED Triage Notes (Signed)
Patient c/o left sided neck pain upon waking up and has continued to get worse. Has taken 600mg  tylenol this am.

## 2019-12-09 ENCOUNTER — Encounter: Payer: 59 | Admitting: Obstetrics and Gynecology

## 2019-12-10 ENCOUNTER — Encounter: Payer: 59 | Admitting: Obstetrics and Gynecology

## 2019-12-29 ENCOUNTER — Encounter: Payer: Self-pay | Admitting: Obstetrics and Gynecology

## 2020-01-06 DIAGNOSIS — F411 Generalized anxiety disorder: Secondary | ICD-10-CM

## 2020-01-06 MED ORDER — SERTRALINE HCL 50 MG PO TABS: 50.0000 mg | ORAL_TABLET | Freq: Every day | ORAL | 1 refills | Status: DC

## 2020-01-06 NOTE — Addendum Note (Signed)
Addended by: Lorre Munroe on: 01/06/2020 02:28 PM   Modules accepted: Orders

## 2020-02-16 DIAGNOSIS — Z3490 Encounter for supervision of normal pregnancy, unspecified, unspecified trimester: Secondary | ICD-10-CM | POA: Insufficient documentation

## 2020-02-23 LAB — OB RESULTS CONSOLE ABO/RH: RH Type: POSITIVE

## 2020-02-23 LAB — OB RESULTS CONSOLE ANTIBODY SCREEN: Antibody Screen: NEGATIVE

## 2020-02-23 LAB — OB RESULTS CONSOLE HIV ANTIBODY (ROUTINE TESTING): HIV: NONREACTIVE

## 2020-02-23 LAB — OB RESULTS CONSOLE RUBELLA ANTIBODY, IGM: Rubella: NON-IMMUNE/NOT IMMUNE

## 2020-02-23 LAB — OB RESULTS CONSOLE HEPATITIS B SURFACE ANTIGEN: Hepatitis B Surface Ag: NEGATIVE

## 2020-02-23 LAB — OB RESULTS CONSOLE RPR: RPR: NONREACTIVE

## 2020-02-23 LAB — OB RESULTS CONSOLE VARICELLA ZOSTER ANTIBODY, IGG: Varicella: IMMUNE

## 2020-03-13 ENCOUNTER — Other Ambulatory Visit: Payer: Self-pay | Admitting: Internal Medicine

## 2020-03-13 DIAGNOSIS — G4700 Insomnia, unspecified: Secondary | ICD-10-CM

## 2020-04-12 ENCOUNTER — Encounter: Payer: Self-pay | Admitting: Intensive Care

## 2020-04-12 ENCOUNTER — Other Ambulatory Visit: Payer: Self-pay

## 2020-04-12 ENCOUNTER — Emergency Department
Admission: EM | Admit: 2020-04-12 | Discharge: 2020-04-12 | Disposition: A | Discharge status: Home / Self Care | Payer: 59 | Attending: Emergency Medicine | Admitting: Emergency Medicine

## 2020-04-12 DIAGNOSIS — Z20822 Contact with and (suspected) exposure to covid-19: Secondary | ICD-10-CM | POA: Insufficient documentation

## 2020-04-12 DIAGNOSIS — Z3A18 18 weeks gestation of pregnancy: Secondary | ICD-10-CM | POA: Diagnosis not present

## 2020-04-12 DIAGNOSIS — O26892 Other specified pregnancy related conditions, second trimester: Secondary | ICD-10-CM | POA: Insufficient documentation

## 2020-04-12 DIAGNOSIS — R55 Syncope and collapse: Secondary | ICD-10-CM

## 2020-04-12 LAB — COMPREHENSIVE METABOLIC PANEL
ALT: 40 U/L (ref 0–44)
AST: 31 U/L (ref 15–41)
Albumin: 3.1 g/dL — ABNORMAL LOW (ref 3.5–5.0)
Alkaline Phosphatase: 55 U/L (ref 38–126)
Anion gap: 8 (ref 5–15)
BUN: 9 mg/dL (ref 6–20)
CO2: 22 mmol/L (ref 22–32)
Calcium: 9 mg/dL (ref 8.9–10.3)
Chloride: 105 mmol/L (ref 98–111)
Creatinine, Ser: 0.67 mg/dL (ref 0.44–1.00)
GFR, Estimated: >60 mL/min (ref >60)
Glucose, Bld: 100 mg/dL — ABNORMAL HIGH (ref 70–99)
Potassium: 4.1 mmol/L (ref 3.5–5.1)
Sodium: 135 mmol/L (ref 135–145)
Total Bilirubin: 0.4 mg/dL (ref 0.3–1.2)
Total Protein: 6.8 g/dL (ref 6.5–8.1)

## 2020-04-12 LAB — CBC
HCT: 41.3 % (ref 36.0–46.0)
Hemoglobin: 14 g/dL (ref 12.0–15.0)
MCH: 29.8 pg (ref 26.0–34.0)
MCHC: 33.9 g/dL (ref 30.0–36.0)
MCV: 87.9 fL (ref 80.0–100.0)
Platelets: 224 10*3/uL (ref 150–400)
RBC: 4.7 MIL/uL (ref 3.87–5.11)
RDW: 12.5 % (ref 11.5–15.5)
WBC: 10.8 10*3/uL — ABNORMAL HIGH (ref 4.0–10.5)
nRBC: 0 % (ref 0.0–0.2)

## 2020-04-12 LAB — RESPIRATORY PANEL BY RT PCR (FLU A&B, COVID)
Influenza A by PCR: NEGATIVE
Influenza B by PCR: NEGATIVE
SARS Coronavirus 2 by RT PCR: NEGATIVE

## 2020-04-12 MED ORDER — SODIUM CHLORIDE 0.9 % IV BOLUS
1000.0000 mL | Freq: Once | INTRAVENOUS | Status: AC
  Administered 2020-04-12: 1000 mL via INTRAVENOUS

## 2020-04-12 NOTE — ED Provider Notes (Signed)
[facility] Emergency Department Provider Note  Time seen: 8:25 AM  I have reviewed the triage vital signs and the nursing notes.   HISTORY  Chief Complaint Near syncope  HPI Veronica Wagner is a 29 y.o. female G1 [redacted] weeks pregnant presents to the emergency department for near syncope.  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  While scrubbed and she began feeling lightheaded and hot like she was going to pass out.  States they were able to help her down to the floor and out of her surgical gown.  Began feeling better after lying down for some time, but due to her being pregnant and near syncope she wanted to get evaluated in the emergency department.  Patient denies any chest pain or shortness of breath.  Does have a cough which just started today.  Has been vaccinated against Covid.  Denies diarrhea but does state nausea and occasional vomiting which she relates to the pregnancy.  No fever.   Past Medical History:  Diagnosis Date  . Anxiety    Not on medication  . Palpitations     Patient Active Problem List   Diagnosis Date Noted  . Insomnia 07/23/2019  . GAD (generalized anxiety disorder) 12/30/2018  . Palpitations 12/30/2018    Past Surgical History:  Procedure Laterality Date  . BREAST SURGERY     Left breast aspiration.   . TONSILLECTOMY      Prior to Admission medications   Medication Sig Start Date End Date Taking? Authorizing Provider  cyclobenzaprine (FLEXERIL) 10 MG tablet Take 1 tablet (10 mg total) by mouth 2 (two) times daily as needed for muscle spasms. 11/27/19   Mickie Bail, NP  ibuprofen (ADVIL) 800 MG tablet Take 1 tablet (800 mg total) by mouth every 8 (eight) hours as needed. 11/27/19   Mickie Bail, NP  sertraline (ZOLOFT) 50 MG tablet Take 1 tablet (50 mg total) by mouth daily. For anxiety. 01/06/20   Lorre Munroe, NP  traZODone (DESYREL) 50 MG tablet TAKE 1/2 TO 1 TABLET BY MOUTH AT BEDTIME AS NEEDED FOR SLEEP  03/17/20   Doreene Nest, NP    Allergies  Allergen Reactions  . Azithromycin Hives and Rash  . Penicillins Hives    Family History  Problem Relation Age of Onset  . Heart disease Mother   . Heart attack Father 85  . Early death Father   . COPD Maternal Grandmother   . Dementia Maternal Grandfather     Social History Social History   Tobacco Use  . Smoking status: Never Smoker  . Smokeless tobacco: Never Used  Vaping Use  . Vaping Use: Never used  Substance Use Topics  . Alcohol use: Yes    Comment: social   . Drug use: Never    Review of Systems Constitutional: Negative for fever.  Lightheadedness, now resolved Cardiovascular: Negative for chest pain. Respiratory: Negative for shortness of breath.  Occasional cough. Gastrointestinal: Negative for abdominal pain.  Intermittent nausea vomiting.  No diarrhea. Genitourinary: Negative for urinary compaints Musculoskeletal: Negative for musculoskeletal complaints Neurological: Negative for headache All other ROS negative  ____________________________________________   PHYSICAL EXAM:  Constitutional: Alert and oriented. Well appearing and in no distress. Eyes: Normal exam ENT      Head: Normocephalic and atraumatic.      Mouth/Throat: Mucous membranes are moist. Cardiovascular: Normal rate, regular rhythm.  Respiratory: Normal respiratory effort without tachypnea nor retractions. Breath sounds are clear  Gastrointestinal: Soft and nontender. No distention.  Musculoskeletal: Nontender with normal range of motion in all extremities.  Neurologic:  Normal speech and language. No gross focal neurologic deficits Skin:  Skin is warm, dry and intact.  Psychiatric: Mood and affect are normal.   ____________________________________________    EKG  EKG viewed and interpreted by myself shows a normal sinus rhythm at 76 bpm with a narrow QRS, normal axis, normal intervals, no concerning ST  changes.  ____________________________________________   INITIAL IMPRESSION / ASSESSMENT AND PLAN / ED COURSE  Pertinent labs & imaging results that were available during my care of the patient were reviewed by me and considered in my medical decision making (see chart for details).   Patient presents emergency department for near syncope.  Currently the patient appears well does have an occasional cough during examination.  No chest pain at any point.  Clear lung sounds, nontender abdomen.  Denies any vaginal bleeding or fluid leakage.  We will check labs, IV hydrate obtain an EKG as well as a Covid test given her recent cough.  We will closely monitor in the emergency department while awaiting results.  Patient agreeable to plan of care.  Overall patient appears very well currently.  Patient's work-up is reassuring including lab work, EKG and a negative Covid test.  Bedside ultrasound shows grossly normal-appearing fetus with a heart rate of 152 with good fetal movement.  Patient will be discharged home with supportive care and routine follow-up.  Patient agreeable to plan of care.  [name] was evaluated in Emergency Department on 04/12/2020 for the symptoms described in the history of present illness. She was evaluated in the context of the global COVID-19 pandemic, which necessitated consideration that the patient might be at risk for infection with the SARS-CoV-2 virus that causes COVID-19. Institutional protocols and algorithms that pertain to the evaluation of patients at risk for COVID-19 are in a state of rapid change based on information released by regulatory bodies including the CDC and federal and state organizations. These policies and algorithms were followed during the patient's care in the ED.  ____________________________________________   FINAL CLINICAL IMPRESSION(S) / ED DIAGNOSES  Near syncope   Minna Antis, MD 04/12/20 1022

## 2020-04-12 NOTE — ED Triage Notes (Signed)
Patient was at work when started to feel lightheaded and "like passing out." Co-workers reports pt never had LOC. Cough present that patient reports just started after near syncopal episode. A&O x4

## 2020-05-30 ENCOUNTER — Observation Stay
Admission: EM | Admit: 2020-05-30 | Discharge: 2020-05-30 | Disposition: A | Discharge status: Home / Self Care | Payer: 59

## 2020-05-30 ENCOUNTER — Other Ambulatory Visit: Payer: Self-pay

## 2020-05-30 ENCOUNTER — Encounter: Payer: Self-pay | Admitting: Obstetrics and Gynecology

## 2020-05-30 ENCOUNTER — Observation Stay: Payer: 59

## 2020-05-30 DIAGNOSIS — O4692 Antepartum hemorrhage, unspecified, second trimester: Principal | ICD-10-CM | POA: Diagnosis present

## 2020-05-30 DIAGNOSIS — R102 Pelvic and perineal pain unspecified side: Secondary | ICD-10-CM | POA: Diagnosis present

## 2020-05-30 DIAGNOSIS — O26892 Other specified pregnancy related conditions, second trimester: Secondary | ICD-10-CM | POA: Diagnosis not present

## 2020-05-30 DIAGNOSIS — O26899 Other specified pregnancy related conditions, unspecified trimester: Secondary | ICD-10-CM | POA: Diagnosis present

## 2020-05-30 DIAGNOSIS — Z3A24 24 weeks gestation of pregnancy: Secondary | ICD-10-CM | POA: Insufficient documentation

## 2020-05-30 LAB — FETAL FIBRONECTIN: Fetal Fibronectin: NEGATIVE

## 2020-05-30 LAB — URINALYSIS, COMPLETE (UACMP) WITH MICROSCOPIC
Bilirubin Urine: NEGATIVE
Glucose, UA: NEGATIVE mg/dL
Hgb urine dipstick: NEGATIVE
Ketones, ur: NEGATIVE mg/dL
Leukocytes,Ua: NEGATIVE
Nitrite: NEGATIVE
Protein, ur: NEGATIVE mg/dL
Specific Gravity, Urine: 1.02 (ref 1.005–1.030)
pH: 7 (ref 5.0–8.0)

## 2020-05-30 LAB — CHLAMYDIA/NGC RT PCR (ARMC ONLY)
Chlamydia Tr: NOT DETECTED
N gonorrhoeae: NOT DETECTED

## 2020-05-30 LAB — WET PREP, GENITAL
Clue Cells Wet Prep HPF POC: NONE SEEN
Sperm: NONE SEEN
Trich, Wet Prep: NONE SEEN
Yeast Wet Prep HPF POC: NONE SEEN

## 2020-05-30 MED ORDER — ACETAMINOPHEN 325 MG PO TABS: 650.0000 mg | ORAL_TABLET | ORAL | Status: DC | PRN

## 2020-05-30 MED ORDER — FLUCONAZOLE 100 MG PO TABS: 100.0000 mg | ORAL_TABLET | ORAL | 0 refills | Status: AC

## 2020-05-30 MED ORDER — CALCIUM CARBONATE ANTACID 500 MG PO CHEW: 2.0000 | CHEWABLE_TABLET | ORAL | Status: DC | PRN

## 2020-05-30 MED ORDER — METRONIDAZOLE 500 MG PO TABS: 500.0000 mg | ORAL_TABLET | Freq: Two times a day (BID) | ORAL | 0 refills | Status: AC

## 2020-05-30 NOTE — Discharge Summary (Signed)
Veronica Wagner is a 30 y.o. female. She is at [redacted]w[redacted]d gestation. Patient's last menstrual period was 12/11/2019 (exact date). Estimated Date of Delivery: 09/16/20  Prenatal care site: [facility]  Chief complaint: pelvic pressure and spotting   [name] presents to L&D with reports of pelvic pain/pressure and new onset spotting.  She reports feeling pelvic pain and pressure starting last Wednesday that has gotten progressively worse.  States "it feels like someone punched me in the vaginal." She also reports irregular period-like cramping that started this weekend and then new onset spotting this today.    S: Resting comfortably. no CTX, no current vaginal bleeding.no LOF,  Active fetal movement.   Maternal Medical History:  Past Medical Hx:  has a past medical history of Anxiety and Palpitations.    Past Surgical Hx:  has a past surgical history that includes Tonsillectomy and Breast surgery.   Allergies  Allergen Reactions  . Azithromycin Hives and Rash  . Penicillins Hives     Prior to Admission medications   Medication Sig Start Date End Date Taking? Authorizing Provider  Prenatal Vit-Fe Fumarate-FA (PRENATAL MULTIVITAMIN) TABS tablet Take 1 tablet by mouth daily at 12 noon.   Yes [provider]  cyclobenzaprine (FLEXERIL) 10 MG tablet Take 1 tablet (10 mg total) by mouth 2 (two) times daily as needed for muscle spasms. Patient not taking: Reported on 05/30/2020 11/27/19   Mickie Bail, NP  ibuprofen (ADVIL) 800 MG tablet Take 1 tablet (800 mg total) by mouth every 8 (eight) hours as needed. Patient not taking: Reported on 05/30/2020 11/27/19   Mickie Bail, NP  sertraline (ZOLOFT) 50 MG tablet Take 1 tablet (50 mg total) by mouth daily. For anxiety. Patient not taking: Reported on 05/30/2020 01/06/20   Lorre Munroe, NP  traZODone (DESYREL) 50 MG tablet TAKE 1/2 TO 1 TABLET BY MOUTH AT BEDTIME AS NEEDED FOR SLEEP Patient not taking: Reported on 05/30/2020 03/17/20   Doreene Nest, NP    Social History: She  reports that she has never smoked. She has never used smokeless tobacco. She reports current alcohol use. She reports that she does not use drugs.  Family History: family history includes COPD in her maternal grandmother; Dementia in her maternal grandfather; Early death in her father; Heart attack (age of onset: 81) in her father; Heart disease in her mother.   Review of Systems: A full review of systems was performed and negative except as noted in the HPI.    O:  BP 136/77   Pulse 87   Temp 98.3 F (36.8 C) (Oral)   Resp 18   Ht 5\' 4"  (1.626 m)   Wt 95.7 kg   LMP 12/11/2019 (Exact Date)   BMI 36.22 kg/m  Results for orders placed or performed during the hospital encounter of 05/30/20 (from the past 48 hour(s))  Urinalysis, Complete w Microscopic Urine, Clean Catch   Collection Time: 05/30/20  7:39 PM  Result Value Ref Range   Color, Urine YELLOW (A) YELLOW   APPearance CLOUDY (A) CLEAR   Specific Gravity, Urine 1.020 1.005 - 1.030   pH 7.0 5.0 - 8.0   Glucose, UA NEGATIVE NEGATIVE mg/dL   Hgb urine dipstick NEGATIVE NEGATIVE   Bilirubin Urine NEGATIVE NEGATIVE   Ketones, ur NEGATIVE NEGATIVE mg/dL   Protein, ur NEGATIVE NEGATIVE mg/dL   Nitrite NEGATIVE NEGATIVE   Leukocytes,Ua NEGATIVE NEGATIVE   RBC / HPF 0-5 0 - 5 RBC/hpf   WBC,  UA 0-5 0 - 5 WBC/hpf   Bacteria, UA RARE (A) NONE SEEN   Squamous Epithelial / LPF 0-5 0 - 5   Mucus PRESENT    Amorphous Crystal PRESENT    Ca Oxalate Crys, UA PRESENT   Chlamydia/NGC rt PCR ([facility] only)   Collection Time: 05/30/20  8:04 PM   Specimen: Cervical/Vaginal swab; GU  Result Value Ref Range   Specimen source GC/Chlam ENDOCERVICAL    Chlamydia Tr NOT DETECTED NOT DETECTED   N gonorrhoeae NOT DETECTED NOT DETECTED  Wet prep, genital   Collection Time: 05/30/20  8:04 PM  Result Value Ref Range   Yeast Wet Prep HPF POC NONE SEEN NONE SEEN   Trich, Wet Prep NONE SEEN NONE SEEN   Clue  Cells Wet Prep HPF POC NONE SEEN NONE SEEN   WBC, Wet Prep HPF POC FEW (A) NONE SEEN   Sperm NONE SEEN   Fetal fibronectin   Collection Time: 05/30/20  8:04 PM  Result Value Ref Range   Fetal Fibronectin NEGATIVE NEGATIVE     Constitutional: NAD, AAOx3  HE/ENT: extraocular movements grossly intact, moist mucous membranes CV: RRR PULM: nl respiratory effort, CTABL     Abd: gravid, non-tender, non-distended, soft    Ext: Non-tender, Nonedmeatous   Psych: mood appropriate, speech normal Pelvic :   External genitalia: mild erythema present  Vagina: moderate erythema, tender to touch, moderate amount of thick white discharge present mixed with a thin gray discharge that coats the vaginal walls   Cervix: erythematous, friable, moderate amount of thin, white discharge and mucus present  SVE: Dilation: Closed Effacement (%): Thick Cervical Position: Middle Exam by:: Tami Lin CNM (visual exam via speculum)   Fetal Monitor: Doppler: 150 bpm Toco: irritability   Assessment: 30 y.o. [redacted]w[redacted]d here for antenatal surveillance during pregnancy.  Principle diagnosis: Vaginal bleeding in 2nd trimester, pelvic pain and pressure in pregnancy   The encounter diagnosis was Vaginal bleeding in pregnancy, second trimester.   Plan:  Labor: not present.   FHR WNL for gestational age   Bleeding noted from cervix after swabs collected but resolved quickly.   FFN and cervical length is reassuring for low risk for PTL   Wet prep was negative for vaginal infections but physical assessment is suspicious for ongoing vaginal infection   Rx for diflucan and flagyl sent to pharmacy  PTL precautions reviewed   Follow up in clinic next week, sooner if symptoms worsen  D/c home stable, precautions reviewed.   ----- Veronica Wagner, CNM Certified Nurse Midwife [facility]

## 2020-05-30 NOTE — Discharge Instructions (Signed)

## 2020-05-30 NOTE — OB Triage Note (Signed)
Pt discharged in stable condition. Discharge instructions reviewed with RN and CNM at bedside. Pt instructed on medications to pick up at pharmacy, follow up appointment to schedule, and to return if symptoms increase including bleeding, cramping, ctx, LOF. VSS. FHT 140 via doppler.

## 2020-05-30 NOTE — OB Triage Note (Signed)
Pt G1P0 [redacted]w[redacted]d presents to birthplace w/ c/o cramping, constant vaginal pressure 7/10, and light spotting while wiping. Cramping and pressure started wed/thr and spotting started today 05/30/20. Intercourse last over 2 weeks ago. VSS, toco applied, doppler 150.

## 2020-06-01 LAB — URINE CULTURE: Culture: 10000 — AB

## 2020-06-07 ENCOUNTER — Other Ambulatory Visit: Payer: Self-pay | Admitting: Primary Care

## 2020-06-07 DIAGNOSIS — G4700 Insomnia, unspecified: Secondary | ICD-10-CM

## 2020-06-07 NOTE — Telephone Encounter (Signed)
Medication was discontinued given pregnancy. Refill request denied.

## 2020-06-07 NOTE — Telephone Encounter (Signed)
Pease advise on refill. Thank you.  

## 2020-09-08 ENCOUNTER — Other Ambulatory Visit: Payer: Self-pay | Admitting: Obstetrics and Gynecology

## 2020-09-08 NOTE — Progress Notes (Signed)
Dating: EDD: 09/16/20,  LMP: 12/11/19 and c/w [place] at 6+5 wks.   Preg c/b: 1. Obesity BMI: 34.67 2. Uterine S>D  [place] for growth/AFI 07/23/20 - EFW=2025g=55%, AFI=182mm@30 % 3. Vaginal bleeding 24wks, tx for yeast and BV  Normal cervical length on 1/27/  4. Rubella non-immune, offer PP vaccination 5. GBS Positive- needs IP prophy; PCN allergy, no clindamycin resistance.  6. H/o mental health diagnoses . Dx: Depression . Medications prior to pregnancy: Zoloft 50mg  and trazodone 50mg  - stopped as soon as she found out she was pregnant . Medical team following her: , NP  . Medications during pregnancy: None . Counseling: None  Prenatal Labs: Blood type/Rh A Pos  Antibody screen neg  Rubella NON-Immune  Varicella Immune  RPR NR  HBsAg Neg  HIV NR  GC neg  Chlamydia neg  Genetic screening Negative MaterniT21  1 hour GTT 79  3 hour GTT   GBS Pos    Contraception: Infant feeding: Tdap: Flu:

## 2020-09-09 ENCOUNTER — Encounter
Admission: EM | Disposition: A | Discharge status: Home / Self Care | Payer: Self-pay | Source: Home / Self Care | Attending: Obstetrics and Gynecology

## 2020-09-09 ENCOUNTER — Encounter: Payer: Self-pay | Admitting: Obstetrics and Gynecology

## 2020-09-09 ENCOUNTER — Inpatient Hospital Stay: Payer: 59 | Admitting: Anesthesiology

## 2020-09-09 ENCOUNTER — Other Ambulatory Visit: Payer: Self-pay

## 2020-09-09 ENCOUNTER — Inpatient Hospital Stay
Admission: EM | Admit: 2020-09-09 | Discharge: 2020-09-11 | DRG: 786 | Disposition: A | Discharge status: Home / Self Care | Payer: 59 | Attending: Obstetrics and Gynecology | Admitting: Obstetrics and Gynecology

## 2020-09-09 DIAGNOSIS — O99214 Obesity complicating childbirth: Secondary | ICD-10-CM | POA: Diagnosis present

## 2020-09-09 DIAGNOSIS — Z880 Allergy status to penicillin: Secondary | ICD-10-CM | POA: Diagnosis not present

## 2020-09-09 DIAGNOSIS — O339 Maternal care for disproportion, unspecified: Secondary | ICD-10-CM | POA: Diagnosis present

## 2020-09-09 DIAGNOSIS — O411230 Chorioamnionitis, third trimester, not applicable or unspecified: Secondary | ICD-10-CM | POA: Diagnosis present

## 2020-09-09 DIAGNOSIS — Z3A39 39 weeks gestation of pregnancy: Secondary | ICD-10-CM | POA: Diagnosis not present

## 2020-09-09 DIAGNOSIS — O26893 Other specified pregnancy related conditions, third trimester: Secondary | ICD-10-CM | POA: Diagnosis present

## 2020-09-09 DIAGNOSIS — Z20822 Contact with and (suspected) exposure to covid-19: Secondary | ICD-10-CM | POA: Diagnosis present

## 2020-09-09 DIAGNOSIS — M549 Dorsalgia, unspecified: Secondary | ICD-10-CM | POA: Diagnosis present

## 2020-09-09 DIAGNOSIS — O99891 Other specified diseases and conditions complicating pregnancy: Secondary | ICD-10-CM | POA: Diagnosis present

## 2020-09-09 DIAGNOSIS — Z23 Encounter for immunization: Secondary | ICD-10-CM | POA: Diagnosis not present

## 2020-09-09 DIAGNOSIS — O99824 Streptococcus B carrier state complicating childbirth: Secondary | ICD-10-CM | POA: Diagnosis present

## 2020-09-09 DIAGNOSIS — O324XX0 Maternal care for high head at term, not applicable or unspecified: Principal | ICD-10-CM | POA: Diagnosis present

## 2020-09-09 DIAGNOSIS — O4692 Antepartum hemorrhage, unspecified, second trimester: Secondary | ICD-10-CM

## 2020-09-09 LAB — PROTEIN / CREATININE RATIO, URINE
Creatinine, Urine: 101 mg/dL
Protein Creatinine Ratio: 0.14 mg/mg{Cre} (ref 0.00–0.15)
Total Protein, Urine: 14 mg/dL

## 2020-09-09 LAB — CBC
HCT: 39.3 % (ref 36.0–46.0)
Hemoglobin: 13.4 g/dL (ref 12.0–15.0)
MCH: 30.2 pg (ref 26.0–34.0)
MCHC: 34.1 g/dL (ref 30.0–36.0)
MCV: 88.5 fL (ref 80.0–100.0)
Platelets: 208 10*3/uL (ref 150–400)
RBC: 4.44 MIL/uL (ref 3.87–5.11)
RDW: 13.9 % (ref 11.5–15.5)
WBC: 12.6 10*3/uL — ABNORMAL HIGH (ref 4.0–10.5)
nRBC: 0 % (ref 0.0–0.2)

## 2020-09-09 LAB — COMPREHENSIVE METABOLIC PANEL
ALT: 13 U/L (ref 0–44)
AST: 15 U/L (ref 15–41)
Albumin: 2.8 g/dL — ABNORMAL LOW (ref 3.5–5.0)
Alkaline Phosphatase: 174 U/L — ABNORMAL HIGH (ref 38–126)
Anion gap: 8 (ref 5–15)
BUN: 6 mg/dL (ref 6–20)
CO2: 21 mmol/L — ABNORMAL LOW (ref 22–32)
Calcium: 9 mg/dL (ref 8.9–10.3)
Chloride: 108 mmol/L (ref 98–111)
Creatinine, Ser: 0.47 mg/dL (ref 0.44–1.00)
GFR, Estimated: >60 mL/min (ref >60)
Glucose, Bld: 73 mg/dL (ref 70–99)
Potassium: 3.9 mmol/L (ref 3.5–5.1)
Sodium: 137 mmol/L (ref 135–145)
Total Bilirubin: 0.6 mg/dL (ref 0.3–1.2)
Total Protein: 6.8 g/dL (ref 6.5–8.1)

## 2020-09-09 LAB — TYPE AND SCREEN
ABO/RH(D): A POS
Antibody Screen: NEGATIVE

## 2020-09-09 LAB — RESP PANEL BY RT-PCR (FLU A&B, COVID) ARPGX2
Influenza A by PCR: NEGATIVE
Influenza B by PCR: NEGATIVE
SARS Coronavirus 2 by RT PCR: NEGATIVE

## 2020-09-09 LAB — ABO/RH: ABO/RH(D): A POS

## 2020-09-09 SURGERY — Surgical Case
Anesthesia: Epidural

## 2020-09-09 MED ORDER — SOD CITRATE-CITRIC ACID 500-334 MG/5ML PO SOLN
30.0000 mL | ORAL | Status: DC | PRN
  Administered 2020-09-09: 30 mL via ORAL
  Filled 2020-09-09: qty 15

## 2020-09-09 MED ORDER — OXYTOCIN-SODIUM CHLORIDE 30-0.9 UT/500ML-% IV SOLN
INTRAVENOUS | Status: DC | PRN
  Administered 2020-09-09: 300 mL via INTRAVENOUS

## 2020-09-09 MED ORDER — LIDOCAINE HCL (PF) 1 % IJ SOLN
INTRAMUSCULAR | Status: DC | PRN
  Administered 2020-09-09: 3 mL via SUBCUTANEOUS

## 2020-09-09 MED ORDER — MISOPROSTOL 200 MCG PO TABS
ORAL_TABLET | ORAL | Status: AC
  Filled 2020-09-09: qty 4

## 2020-09-09 MED ORDER — BUPIVACAINE HCL (PF) 0.5 % IJ SOLN
INTRAMUSCULAR | Status: AC
  Filled 2020-09-09: qty 30

## 2020-09-09 MED ORDER — OXYCODONE HCL 5 MG PO TABS: 10.0000 mg | ORAL_TABLET | ORAL | Status: DC | PRN

## 2020-09-09 MED ORDER — FENTANYL CITRATE (PF) 100 MCG/2ML IJ SOLN
INTRAMUSCULAR | Status: DC | PRN
  Administered 2020-09-09 (×2): 50 ug via INTRAVENOUS

## 2020-09-09 MED ORDER — DEXAMETHASONE SODIUM PHOSPHATE 10 MG/ML IJ SOLN
INTRAMUSCULAR | Status: DC | PRN
  Administered 2020-09-09: 10 mg via INTRAVENOUS

## 2020-09-09 MED ORDER — OXYTOCIN-SODIUM CHLORIDE 30-0.9 UT/500ML-% IV SOLN
1.0000 m[IU]/min | INTRAVENOUS | Status: DC
  Administered 2020-09-09: 2 m[IU]/min via INTRAVENOUS

## 2020-09-09 MED ORDER — BUPIVACAINE LIPOSOME 1.3 % IJ SUSP
INTRAMUSCULAR | Status: AC
  Filled 2020-09-09: qty 20

## 2020-09-09 MED ORDER — ACETAMINOPHEN 10 MG/ML IV SOLN
INTRAVENOUS | Status: AC
  Filled 2020-09-09: qty 100

## 2020-09-09 MED ORDER — METHYLERGONOVINE MALEATE 0.2 MG/ML IJ SOLN
INTRAMUSCULAR | Status: DC | PRN
  Administered 2020-09-09: .2 mg via INTRAMUSCULAR

## 2020-09-09 MED ORDER — GENTAMICIN SULFATE 40 MG/ML IJ SOLN
5.0000 mg/kg | INTRAVENOUS | Status: DC
  Administered 2020-09-09 – 2020-09-10 (×2): 380 mg via INTRAVENOUS
  Filled 2020-09-09 (×3): qty 9.5

## 2020-09-09 MED ORDER — MORPHINE SULFATE (PF) 0.5 MG/ML IJ SOLN
INTRAMUSCULAR | Status: DC | PRN
  Administered 2020-09-09: 4 mg via EPIDURAL

## 2020-09-09 MED ORDER — AMMONIA AROMATIC IN INHA
RESPIRATORY_TRACT | Status: AC
  Filled 2020-09-09: qty 10

## 2020-09-09 MED ORDER — EPHEDRINE 5 MG/ML INJ
10.0000 mg | INTRAVENOUS | Status: DC | PRN
  Administered 2020-09-09: 5 mg via INTRAVENOUS

## 2020-09-09 MED ORDER — METHYLERGONOVINE MALEATE 0.2 MG/ML IJ SOLN
INTRAMUSCULAR | Status: AC
  Filled 2020-09-09: qty 1

## 2020-09-09 MED ORDER — ACETAMINOPHEN 325 MG PO TABS: 650.0000 mg | ORAL_TABLET | ORAL | Status: DC | PRN

## 2020-09-09 MED ORDER — LIDOCAINE-EPINEPHRINE (PF) 1.5 %-1:200000 IJ SOLN
INTRAMUSCULAR | Status: DC | PRN
  Administered 2020-09-09: 3 mL via EPIDURAL

## 2020-09-09 MED ORDER — PHENYLEPHRINE 40 MCG/ML (10ML) SYRINGE FOR IV PUSH (FOR BLOOD PRESSURE SUPPORT): 80.0000 ug | PREFILLED_SYRINGE | INTRAVENOUS | Status: DC | PRN

## 2020-09-09 MED ORDER — OXYTOCIN 10 UNIT/ML IJ SOLN
INTRAMUSCULAR | Status: AC
  Filled 2020-09-09: qty 2

## 2020-09-09 MED ORDER — MIDAZOLAM HCL 2 MG/2ML IJ SOLN
INTRAMUSCULAR | Status: AC
  Filled 2020-09-09: qty 2

## 2020-09-09 MED ORDER — SODIUM CHLORIDE (PF) 0.9 % IJ SOLN
INTRAMUSCULAR | Status: AC
  Filled 2020-09-09: qty 50

## 2020-09-09 MED ORDER — EPHEDRINE 5 MG/ML INJ
INTRAVENOUS | Status: AC
  Filled 2020-09-09: qty 4

## 2020-09-09 MED ORDER — FENTANYL 2.5 MCG/ML W/ROPIVACAINE 0.15% IN NS 100 ML EPIDURAL (ARMC)
12.0000 mL/h | EPIDURAL | Status: DC
  Administered 2020-09-09: 12 mL/h via EPIDURAL

## 2020-09-09 MED ORDER — ONDANSETRON HCL 4 MG/2ML IJ SOLN
INTRAMUSCULAR | Status: DC | PRN
  Administered 2020-09-09: 4 mg via INTRAVENOUS

## 2020-09-09 MED ORDER — BUPIVACAINE HCL (PF) 0.25 % IJ SOLN
INTRAMUSCULAR | Status: DC | PRN
  Administered 2020-09-09: 4 mL via EPIDURAL

## 2020-09-09 MED ORDER — ONDANSETRON HCL 4 MG/2ML IJ SOLN
4.0000 mg | Freq: Four times a day (QID) | INTRAMUSCULAR | Status: DC | PRN
  Administered 2020-09-09: 4 mg via INTRAVENOUS
  Filled 2020-09-09: qty 2

## 2020-09-09 MED ORDER — LACTATED RINGERS IV SOLN
500.0000 mL | INTRAVENOUS | Status: DC | PRN
  Administered 2020-09-09: 500 mL via INTRAVENOUS

## 2020-09-09 MED ORDER — BUPIVACAINE LIPOSOME 1.3 % IJ SUSP
INTRAMUSCULAR | Status: DC | PRN
  Administered 2020-09-09: 100 mL

## 2020-09-09 MED ORDER — LACTATED RINGERS IV SOLN
500.0000 mL | Freq: Once | INTRAVENOUS | Status: AC
  Administered 2020-09-09: 500 mL via INTRAVENOUS

## 2020-09-09 MED ORDER — OXYTOCIN-SODIUM CHLORIDE 30-0.9 UT/500ML-% IV SOLN
2.5000 [IU]/h | INTRAVENOUS | Status: DC
  Filled 2020-09-09: qty 500

## 2020-09-09 MED ORDER — FENTANYL CITRATE (PF) 100 MCG/2ML IJ SOLN
INTRAMUSCULAR | Status: AC
  Filled 2020-09-09: qty 2

## 2020-09-09 MED ORDER — OXYTOCIN BOLUS FROM INFUSION: 333.0000 mL | Freq: Once | INTRAVENOUS | Status: DC

## 2020-09-09 MED ORDER — LIDOCAINE HCL (PF) 2 % IJ SOLN
INTRAMUSCULAR | Status: DC | PRN
  Administered 2020-09-09 (×4): 100 mg via EPIDURAL

## 2020-09-09 MED ORDER — LACTATED RINGERS IV SOLN: INTRAVENOUS | Status: DC

## 2020-09-09 MED ORDER — LIDOCAINE HCL (PF) 1 % IJ SOLN: 30.0000 mL | INTRAMUSCULAR | Status: DC | PRN

## 2020-09-09 MED ORDER — DIPHENHYDRAMINE HCL 50 MG/ML IJ SOLN: 12.5000 mg | INTRAMUSCULAR | Status: DC | PRN

## 2020-09-09 MED ORDER — MORPHINE SULFATE (PF) 0.5 MG/ML IJ SOLN
INTRAMUSCULAR | Status: AC
  Filled 2020-09-09: qty 10

## 2020-09-09 MED ORDER — ACETAMINOPHEN 10 MG/ML IV SOLN
INTRAVENOUS | Status: DC | PRN
  Administered 2020-09-09: 1000 mg via INTRAVENOUS

## 2020-09-09 MED ORDER — FENTANYL 2.5 MCG/ML W/ROPIVACAINE 0.15% IN NS 100 ML EPIDURAL (ARMC)
EPIDURAL | Status: AC
  Filled 2020-09-09: qty 100

## 2020-09-09 MED ORDER — SODIUM CHLORIDE 0.9 % IV SOLN
INTRAVENOUS | Status: DC | PRN
  Administered 2020-09-09: 50 ug/min via INTRAVENOUS

## 2020-09-09 MED ORDER — LIDOCAINE HCL 2 % IJ SOLN
INTRAMUSCULAR | Status: AC
  Filled 2020-09-09: qty 20

## 2020-09-09 MED ORDER — OXYTOCIN-SODIUM CHLORIDE 30-0.9 UT/500ML-% IV SOLN
INTRAVENOUS | Status: AC
  Filled 2020-09-09: qty 500

## 2020-09-09 MED ORDER — TERBUTALINE SULFATE 1 MG/ML IJ SOLN: 0.2500 mg | Freq: Once | INTRAMUSCULAR | Status: DC | PRN

## 2020-09-09 MED ORDER — FENTANYL CITRATE (PF) 100 MCG/2ML IJ SOLN: 50.0000 ug | INTRAMUSCULAR | Status: DC | PRN

## 2020-09-09 MED ORDER — MIDAZOLAM HCL 2 MG/2ML IJ SOLN
INTRAMUSCULAR | Status: DC | PRN
  Administered 2020-09-09: 2 mg via INTRAVENOUS

## 2020-09-09 MED ORDER — OXYCODONE HCL 5 MG PO TABS: 5.0000 mg | ORAL_TABLET | ORAL | Status: DC | PRN

## 2020-09-09 MED ORDER — CLINDAMYCIN PHOSPHATE 900 MG/50ML IV SOLN
900.0000 mg | Freq: Three times a day (TID) | INTRAVENOUS | Status: DC
  Administered 2020-09-09 (×2): 900 mg via INTRAVENOUS
  Filled 2020-09-09 (×2): qty 50

## 2020-09-09 MED ORDER — LIDOCAINE HCL (PF) 1 % IJ SOLN
INTRAMUSCULAR | Status: AC
  Filled 2020-09-09: qty 30

## 2020-09-09 SURGICAL SUPPLY — 33 items
BARRIER ADHS 3X4 INTERCEED (GAUZE/BANDAGES/DRESSINGS) ×2 IMPLANT
CHLORAPREP W/TINT 26 (MISCELLANEOUS) ×2 IMPLANT
COVER WAND RF STERILE (DRAPES) ×2 IMPLANT
DRSG TELFA 3X8 NADH (GAUZE/BANDAGES/DRESSINGS) ×2 IMPLANT
DRSG TELFA 4X14 ISLAND NADH (GAUZE/BANDAGES/DRESSINGS) ×2 IMPLANT
ELECT REM PT RETURN 9FT ADLT (ELECTROSURGICAL) ×2
ELECTRODE REM PT RTRN 9FT ADLT (ELECTROSURGICAL) ×1 IMPLANT
GAUZE 4X4 16PLY RFD (DISPOSABLE) ×2 IMPLANT
GAUZE SPONGE 4X4 12PLY STRL (GAUZE/BANDAGES/DRESSINGS) ×2 IMPLANT
GOWN STRL REUS W/ TWL LRG LVL3 (GOWN DISPOSABLE) ×3 IMPLANT
GOWN STRL REUS W/TWL LRG LVL3 (GOWN DISPOSABLE) ×3
MANIFOLD NEPTUNE II (INSTRUMENTS) ×2 IMPLANT
MAT PREVALON FULL STRYKER (MISCELLANEOUS) ×2 IMPLANT
NEEDLE HYPO 25GX1X1/2 BEV (NEEDLE) ×2 IMPLANT
NS IRRIG 1000ML POUR BTL (IV SOLUTION) ×2 IMPLANT
PACK C SECTION AR (MISCELLANEOUS) ×2 IMPLANT
PAD OB MATERNITY 4.3X12.25 (PERSONAL CARE ITEMS) ×2 IMPLANT
PAD PREP 24X41 OB/GYN DISP (PERSONAL CARE ITEMS) ×2 IMPLANT
PENCIL SMOKE EVACUATOR (MISCELLANEOUS) ×2 IMPLANT
SPONGE LAP 18X18 RF (DISPOSABLE) ×4 IMPLANT
STAPLER INSORB 30 2030 C-SECTI (MISCELLANEOUS) ×2 IMPLANT
SUT MNCRL 4-0 (SUTURE) ×1
SUT MNCRL 4-0 27XMFL (SUTURE) ×1
SUT MON AB 3-0 SH 27 (SUTURE) ×2 IMPLANT
SUT VIC AB 0 CT1 36 (SUTURE) ×4 IMPLANT
SUT VIC AB 0 CTX 36 (SUTURE) ×2
SUT VIC AB 0 CTX36XBRD ANBCTRL (SUTURE) ×2 IMPLANT
SUT VIC AB 2-0 SH 27 (SUTURE) ×2
SUT VIC AB 2-0 SH 27XBRD (SUTURE) ×2 IMPLANT
SUT VICRYL 3-0 27IN SH (SUTURE) ×2 IMPLANT
SUT VICRYL+ 3-0 36IN CT-1 (SUTURE) ×4 IMPLANT
SUTURE MNCRL 4-0 27XMF (SUTURE) ×1 IMPLANT
SYR 30ML LL (SYRINGE) ×4 IMPLANT

## 2020-09-09 NOTE — H&P (Signed)
OB History & Physical   History of Present Illness:  Chief Complaint: painful UCs- mostly in back.   HPI:  Veronica Wagner is a 30 y.o. G1P0000 female at [redacted]w[redacted]d dated by LMP: 12/11/19 and c/w [place] at 6+5 wks. EDD 09/16/20.  She presents to L&D for painful back UCs since yesterday but still irregular. Intermittent bloody show. Reports active FM and no LOF.    Pregnancy Issues: 1. Obesity BMI: 34.67 2. Uterine S>D  [place] for growth/AFI 07/23/20 - EFW=2025g=55%, AFI=177mm@30 % 3. Vaginal bleeding 24wks, tx for yeast and BV  Normal cervical length on 1/27/  4. Rubella non-immune, offer PP vaccination 5. GBS Positive- needs IP prophy; PCN allergy, no clindamycin resistance.  6. H/o mental health diagnoses . Dx: Depression . Medications prior to pregnancy: Zoloft 50mg  and trazodone 50mg  - stopped as soon as she found out she was pregnant . Medical team following her: Pleas Koch, NP  . Medications during pregnancy: None . Counseling: None   Maternal Medical History:   Past Medical History:  Diagnosis Date  . Anxiety    Not on medication  . Palpitations     Past Surgical History:  Procedure Laterality Date  . BREAST SURGERY     Left breast aspiration.   . TONSILLECTOMY      Allergies  Allergen Reactions  . Azithromycin Hives and Rash  . Penicillins Hives    Prior to Admission medications   Medication Sig Start Date End Date Taking? Authorizing Provider  Prenatal Vit-Fe Fumarate-FA (PRENATAL MULTIVITAMIN) TABS tablet Take 1 tablet by mouth daily at 12 noon.   Yes [provider]     Prenatal care site: [place] History: She  reports that she has never smoked. She has never used smokeless tobacco. She reports current alcohol use. She reports that she does not use drugs.  Family History: family history includes COPD in her maternal grandmother; Dementia in her maternal grandfather; Early death in her father; Heart attack (age of onset: 93) in  her father; Heart disease in her mother.   Review of Systems: A full review of systems was performed and negative except as noted in the HPI.     Physical Exam:  Vital Signs: BP 121/72 (BP Location: Left Arm)   Pulse 82   Temp 98.4 F (36.9 C) (Oral)   Resp 16   Ht 5\' 4"  (1.626 m)   Wt 106.1 kg   LMP 12/11/2019 (Exact Date)   SpO2 99%   BMI 40.17 kg/m   General: no acute distress.  HEENT: normocephalic, atraumatic Heart: regular rate & rhythm.  No murmurs/rubs/gallops Lungs: clear to auscultation bilaterally, normal respiratory effort Abdomen: soft, gravid, non-tender;  EFW: 8 lbs Pelvic:   External: Normal external female genitalia  Cervix: Dilation: Lip/rim / Effacement (%): 90 / Station: -1    Extremities: non-tender, symmetric, no edema bilaterally.  DTRs: 2+  Neurologic: Alert & oriented x 3.    Results for orders placed or performed during the hospital encounter of 09/09/20 (from the past 24 hour(s))  CBC     Status: Abnormal   Collection Time: 09/09/20 11:12 AM  Result Value Ref Range   WBC 12.6 (H) 4.0 - 10.5 K/uL   RBC 4.44 3.87 - 5.11 MIL/uL   Hemoglobin 13.4 12.0 - 15.0 g/dL   HCT 39.3 36.0 - 46.0 %   MCV 88.5 80.0 - 100.0 fL   MCH 30.2 26.0 - 34.0 pg   MCHC 34.1  30.0 - 36.0 g/dL   RDW 13.9 11.5 - 15.5 %   Platelets 208 150 - 400 K/uL   nRBC 0.0 0.0 - 0.2 %  Type and screen     Status: None   Collection Time: 09/09/20 11:12 AM  Result Value Ref Range   ABO/RH(D) A POS    Antibody Screen NEG    Sample Expiration      09/12/2020,2359 Performed at [facility], [address] [place], [place] [address]   Comprehensive metabolic panel     Status: Abnormal   Collection Time: 09/09/20 11:12 AM  Result Value Ref Range   Sodium 137 135 - 145 mmol/L   Potassium 3.9 3.5 - 5.1 mmol/L   Chloride 108 98 - 111 mmol/L   CO2 21 (L) 22 - 32 mmol/L   Glucose, Bld 73 70 - 99 mg/dL   BUN 6 6 - 20 mg/dL   Creatinine, Ser 0.47 0.44 - 1.00 mg/dL   Calcium  9.0 8.9 - 10.3 mg/dL   Total Protein 6.8 6.5 - 8.1 g/dL   Albumin 2.8 (L) 3.5 - 5.0 g/dL   AST 15 15 - 41 U/L   ALT 13 0 - 44 U/L   Alkaline Phosphatase 174 (H) 38 - 126 U/L   Total Bilirubin 0.6 0.3 - 1.2 mg/dL   GFR, Estimated >60 >60 mL/min   Anion gap 8 5 - 15  Protein / creatinine ratio, urine     Status: None   Collection Time: 09/09/20 11:12 AM  Result Value Ref Range   Creatinine, Urine 101 mg/dL   Total Protein, Urine 14 mg/dL   Protein Creatinine Ratio 0.14 0.00 - 0.15 mg/mg[Cre]  Resp Panel by RT-PCR (Flu A&B, Covid) Urine, Clean Catch     Status: None   Collection Time: 09/09/20 11:13 AM   Specimen: Urine, Clean Catch; Nasopharyngeal(NP) swabs in vial transport medium  Result Value Ref Range   SARS Coronavirus 2 by RT PCR NEGATIVE NEGATIVE   Influenza A by PCR NEGATIVE NEGATIVE   Influenza B by PCR NEGATIVE NEGATIVE  ABO/Rh     Status: None   Collection Time: 09/09/20 12:44 PM  Result Value Ref Range   ABO/RH(D)      A POS Performed at [facility], [address] [place], [place] [address]     Pertinent Results:  Prenatal Labs: Blood type/Rh A Pos  Antibody screen neg  Rubella NON-Immune  Varicella Immune  RPR NR  HBsAg Neg  HIV NR  GC neg  Chlamydia neg  Genetic screening Negative MaterniT21  1 hour GTT 79  3 hour GTT   GBS Pos    FHT: 140bpm, moderate variability, + accels, no decels TOCO: irreg, q3-8 with UI SVE:5/80/-2 on admission BBOW noted by RN exam.    Cephalic by leopolds and exam  No results found.  Assessment:  Veronica Wagner is a 30 y.o. G1P0000 female at [redacted]w[redacted]d with active labor.   Plan:  1. Admit to Labor & Delivery; consents reviewed and obtained - COVID negative - start Abx for GBS pos.  - plan augment with Pitocin.   2. Fetal Well being  - Fetal Tracing: Cat I tracing - Group B Streptococcus ppx indicated: pos - Presentation: cephalic confirmed by exam   3. Routine OB: - Prenatal labs reviewed, as above -  Rh A Pos - CBC, T&S, RPR on admit - Clear fluids, IVF  4. Monitoring of Labor -  Contractions: external toco in place -  Pelvis unproven, adequate for TOL -  Plan for augmentation with Pitocin.  -  Plan for continuous fetal monitoring  -  Maternal pain control as desired - Anticipate vaginal delivery  5. Post Partum Planning: - Infant feeding: breast - Contraception: TBD - Flu: 02/2020 - Tdap: 08/25/20 - MMR prior to [place], CNM 09/09/20 7:25 PM

## 2020-09-09 NOTE — Anesthesia Preprocedure Evaluation (Signed)
Anesthesia Evaluation  Patient identified by MRN, date of birth, ID band Patient awake    Reviewed: Allergy & Precautions, H&P , NPO status , Patient's Chart, lab work & pertinent test results  Airway Mallampati: III  TM Distance: >3 FB Neck ROM: full    Dental no notable dental hx. (+) Teeth Intact   Pulmonary    Pulmonary exam normal        Cardiovascular Normal cardiovascular exam     Neuro/Psych PSYCHIATRIC DISORDERS Anxiety    GI/Hepatic Neg liver ROS, GERD  Medicated and Controlled,  Endo/Other  negative endocrine ROS  Renal/GU negative Renal ROS     Musculoskeletal   Abdominal   Peds  Hematology negative hematology ROS (+)   Anesthesia Other Findings   Reproductive/Obstetrics (+) Pregnancy                             Anesthesia Physical Anesthesia Plan  ASA: II  Anesthesia Plan: Epidural   Post-op Pain Management:    Induction:   PONV Risk Score and Plan:   Airway Management Planned:   Additional Equipment:   Intra-op Plan:   Post-operative Plan:   Informed Consent: I have reviewed the patients History and Physical, chart, labs and discussed the procedure including the risks, benefits and alternatives for the proposed anesthesia with the patient or authorized representative who has indicated his/her understanding and acceptance.     Dental Advisory Given  Plan Discussed with: Anesthesiologist and CRNA  Anesthesia Plan Comments:         Anesthesia Quick Evaluation

## 2020-09-09 NOTE — Discharge Summary (Signed)
Obstetrical Discharge Summary  Patient Name: Veronica Wagner DOB: Nov 30, 1990 MRN: [id number]  Date of Admission: 09/09/2020 Date of Delivery: 09/09/20 Delivered by: Hassan Buckler CNM Date of Discharge: 09/11/2020  Primary OB: [place] [id number] last menstrual period was 12/11/2019 (exact date). EDC Estimated Date of Delivery: 09/16/20 Gestational Age at Delivery: [redacted]w[redacted]d   Antepartum complications:  1. Obesity BMI: 34.67 2. Uterine S>D  [place] for growth/AFI 07/23/20 - EFW=2025g=55%, AFI=1108mm@30 % 3. Vaginal bleeding 24wks, tx for yeast and BV  Normal cervical length on 06/24/20 4. Rubella non-immune, offer PP vaccination 5. GBS Positive- needs IP prophy; PCN allergy, no clindamycin resistance.  6. H/o mental health diagnoses-anxiety/depression  Admitting Diagnosis:  Secondary Diagnosis: Patient Active Problem List   Diagnosis Date Noted  . Back pain affecting pregnancy in third trimester 09/09/2020  . Pelvic pain in pregnancy 05/30/2020  . Vaginal bleeding in pregnancy, second trimester 05/30/2020  . Supervision of normal pregnancy 02/16/2020  . Insomnia 07/23/2019  . GAD (generalized anxiety disorder) 12/30/2018  . Palpitations 12/30/2018  . Other premature beats 04/05/2010    Augmentation: Pitocin Complications: None Intrapartum complications/course: arrived at 5cm in early labor and SROM, augmented with Pitocin. Arrest of descent at complete dilation and also maternal temp with fetal tachycardia. Decision for LTCS. See Op note.  Date of Delivery: 09/09/20 Delivered By: Hassan Buckler CNM Delivery Type: spontaneous vaginal delivery Anesthesia: epidural Placenta: manual Laceration: none Episiotomy: none Newborn Data: Live born female "Loletha Grayer" Birth Weight: 7 lb 3.3 oz (3270 g) APGAR: 8, 9  Newborn Delivery   Birth date/time: 09/09/2020 22:15:00 Delivery type: C-Section, Low Transverse C-section categorization: Primary      Postpartum Procedures: None    Edinburgh:  Edinburgh Postnatal Depression Scale Screening Tool 09/10/2020 09/10/2020  I have been able to laugh and see the funny side of things. 0 (No Data)  I have looked forward with enjoyment to things. 0 -  I have blamed myself unnecessarily when things went wrong. 1 -  I have been anxious or worried for no good reason. 1 -  I have felt scared or panicky for no good reason. 0 -  Things have been getting on top of me. 0 -  I have been so unhappy that I have had difficulty sleeping. 0 -  I have felt sad or miserable. 0 -  I have been so unhappy that I have been crying. 0 -  The thought of harming myself has occurred to me. 0 -  Edinburgh Postnatal Depression Scale Total 2 -      Post partum course-Cesarean Section:  Patient had an uncomplicated postpartum course.  By time of discharge on POD#2, her pain was controlled on oral pain medications; she had appropriate lochia and was ambulating, voiding without difficulty, tolerating regular diet and passing flatus.   She was deemed stable for discharge to home.    Discharge Physical Exam:  BP 114/61 (BP Location: Right Arm)   Pulse 100   Temp 98.5 F (36.9 C) (Oral)   Resp 18   Ht 5\' 4"  (1.626 m)   Wt 106.1 kg   LMP 12/11/2019 (Exact Date)   SpO2 99%   Breastfeeding Unknown   BMI 40.17 kg/m   General: NAD CV: RRR Pulm: CTABL, nl effort ABD: s/nd/nt, fundus firm and below the umbilicus Lochia: moderate Incision: c/d/i, healing well, no significant drainage, no dehiscence, no significant erythema. Covered with OP Site Honeycomb occlusive dressing prior to discharge  DVT Evaluation: LE non-ttp, no evidence of DVT  on exam.  Hemoglobin  Date Value Ref Range Status  09/11/2020 9.7 (L) 12.0 - 15.0 g/dL Final   HCT  Date Value Ref Range Status  09/11/2020 28.8 (L) 36.0 - 46.0 % Final     Disposition: stable, discharge to home. Baby Feeding: breastmilk Baby Disposition: home with mom  Rh Immune globulin given:  n/a Rubella vaccine given: NON-immune, MMR offered prior to discharge  Varicella vaccine given: immune Tdap vaccine given in AP or PP setting: 08/25/20 Flu vaccine given in AP or PP setting: 02/2020  Contraception: TBD  Prenatal Labs:  Blood type/Rh A Pos  Antibody screen neg  Rubella NON-Immune  Varicella Immune  RPR NR  HBsAg Neg  HIV NR  GC neg  Chlamydia neg  Genetic screening NegativeMaterniT21  1 hour GTT 79  3 hour GTT   GBS Pos      Plan:  Veronica Wagner was discharged to home in good condition. Follow-up appointment with delivering provider in 2 weeks.  Discharge Medications: Allergies as of 09/11/2020      Reactions   Azithromycin Hives, Rash   Penicillins Hives      Medication List    TAKE these medications   acetaminophen 500 MG tablet Commonly known as: TYLENOL Take 2 tablets (1,000 mg total) by mouth every 6 (six) hours.   ferrous sulfate 325 (65 FE) MG tablet Take 1 tablet (325 mg total) by mouth daily.   ibuprofen 800 MG tablet Commonly known as: ADVIL Take 1 tablet (800 mg total) by mouth every 8 (eight) hours as needed for mild pain or moderate pain.   oxyCODONE 5 MG immediate release tablet Commonly known as: Oxy IR/ROXICODONE Take 1-2 tablets (5-10 mg total) by mouth every 4 (four) hours as needed for up to 7 days for moderate pain.   prenatal multivitamin Tabs tablet Take 1 tablet by mouth daily at 12 noon.   senna-docusate 8.6-50 MG tablet Commonly known as: Senokot-S Take 2 tablets by mouth daily.   simethicone 80 MG chewable tablet Commonly known as: MYLICON Chew 1 tablet (80 mg total) by mouth 3 (three) times daily after meals.        Follow-up Information    Benjaman Kindler, MD Follow up in 2 week(s).   Specialty: Obstetrics and Gynecology Why: post-op visit Contact information: [place]  [id number] [phone]               Signed:  Minda Meo, CNM 09/11/2020  10:13 AM  Drinda Butts, CNM Certified Nurse Midwife [facility]

## 2020-09-09 NOTE — Transfer of Care (Signed)
Immediate Anesthesia Transfer of Care Note  Patient: Veronica Wagner  Procedure(s) Performed: CESAREAN SECTION  Patient Location: LDR 4  Anesthesia Type:Epidural  Level of Consciousness: awake, alert  and oriented  Airway & Oxygen Therapy: Patient Spontanous Breathing  Post-op Assessment: Post -op Vital signs reviewed and stable  Post vital signs: stable  Last Vitals:  Vitals Value Taken Time  BP 127/66 09/09/20 2343  Temp    Pulse 74 09/09/20 2345  Resp 18 09/09/20 2345  SpO2 97 % 09/09/20 2345  Vitals shown include unvalidated device data.  Last Pain:  Vitals:   09/09/20 2021  TempSrc: Axillary  PainSc:          Complications: No complications documented.

## 2020-09-09 NOTE — Progress Notes (Signed)
Labor Progress Note  Veronica Wagner is a 30 y.o. G1P0000 at [redacted]w[redacted]d by LMP admitted for active labor  Subjective: feeling intermittent pressure and significant nausea with vomiting.   Objective: BP 121/72 (BP Location: Left Arm)   Pulse 82   Temp 99.6 F (37.6 C) (Axillary)   Resp 16   Ht 5\' 4"  (1.626 m)   Wt 106.1 kg   LMP 12/11/2019 (Exact Date)   SpO2 99%   BMI 40.17 kg/m  Notable VS details: reviewed- axillary temp 99.6  Fetal Assessment: FHT:  FHR: 170 bpm, variability: minimal ,  accelerations:  Abscent,  decelerations:  Absent Category/reactivity:  Category II UC:   regular, every 2 minutes, PItocin at 7mu/min SVE:   C/C/-1, no descent with push, cough or vomit.  - unable to determine fetal position, suspect OP Membrane status: SROM at 1233 Amniotic color: clear initially, now meconium stained fluid noted.   Labs: Lab Results  Component Value Date   WBC 12.6 (H) 09/09/2020   HGB 13.4 09/09/2020   HCT 39.3 09/09/2020   MCV 88.5 09/09/2020   PLT 208 09/09/2020    Assessment / Plan: Arrest of decent, G1 at 39.0  Labor: arrest of descent Preeclampsia:  no e/o Pre-e Fetal Wellbeing:  Category II- tachycardia with new onset meconium fluid.  Pain Control:  Epidural I/D:  chorio- maternal temp 99.6 axillary, fetal tachy to 170bpm, Continue Clinda and add Gent 5mg /kg- discussed with pharmacist, to use adjusted weight.  Anticipated MOD:  Cesarean Section; discussed with Dr 09/11/2020, will proceed to CS for arrest of descent and FHR tachy, meconium  , CNM 09/09/2020, 8:56 PM

## 2020-09-09 NOTE — Progress Notes (Addendum)
30yo G1P0 at 39+0wks, spontaneous labor, now fully dilated since 5:30 without fetal descent. She actually was -1/0 since 1344 today, without descent since admission, though she did dilate without descent. She developed a fever and fetal tachycardia, still with intermittent moderate variability and accels. Is on gent/clinda for chorio. Arrest of descent/ CPD, and decision made to proceed with cesarean.  The risks of cesarean section discussed with the patient included but were not limited to: bleeding which may require transfusion or reoperation; infection which may require antibiotics; injury to bowel, bladder, ureters or other surrounding organs; injury to the fetus; need for additional procedures including hysterectomy in the event of a life-threatening hemorrhage; placental abnormalities wth subsequent pregnancies, incisional problems, thromboembolic phenomenon and other postoperative/anesthesia complications. The patient concurred with the proposed plan, giving informed written consent for the procedure. Anesthesia and OR aware. Preoperative prophylactic antibiotics and SCDs ordered on call to the OR.  To OR when ready.

## 2020-09-09 NOTE — OB Triage Note (Signed)
Pt Veronica Wagner 30 y.o. presents to the ED reporting irregular ctx . Pt is a G1P0000 at [redacted]w[redacted]d . Pt denies signs and symptons consistent with rupture of membranes or active vaginal bleeding. Pt reports irregular ctx and reporting 0/10 pain and states positive fetal movement. External FM and TOCO applied to non-tender abdomen and assessing. Initial FHR 150 . Vital signs obtained and within normal limits. Provider notified of pt.

## 2020-09-09 NOTE — Op Note (Signed)
Cesarean Section Procedure Note  Date of procedure: 09/09/2020   Pre-operative Diagnosis: Intrauterine pregnancy at [redacted]w[redacted]d;  - failure to descend - chorio  Post-operative Diagnosis: same, delivered.  Procedure: Primary Low Transverse Cesarean Section through Pfannenstiel incision  Surgeon: Christeen Douglas, MD  Assistant(s):  Heloise Ochoa, CNM   Anesthesia: Epidural anesthesia  Anesthesiologist: Corinda Gubler, MD Anesthesiologist: Corinda Gubler, MD CRNA: Irving Burton, CRNA; Karoline Caldwell, CRNA  Estimated Blood Loss:  800         Drains: Foley         Total IV Fluids:  Urine Output:         Specimens: Arterial cord blood         Complications:  None; patient tolerated the procedure well.         Disposition: PACU - hemodynamically stable.         Condition: stable  Findings:  A female infant "Huston Foley" in cephalic transverse presentation. Amniotic fluid - Meconium  Birth weight 3270 g.  Apgars of 8 and 9 at one and five minutes respectively.  Intact placenta with a three-vessel cord.  Grossly normal uterus, tubes and ovaries bilaterally. Mild intraabdominal adhesions were noted between omentum and ovaries and fallopian tubes and uterus.  Indications: 30yo G1P0 at 39+0wks, spontaneous labor, now fully dilated since 5:30 without fetal descent. She actually was -1/0 since 1344 today, without descent since admission, though she did dilate without descent. She developed a fever and fetal tachycardia, still with intermittent moderate variability and accels. Is on gent/clinda for chorio. Arrest of descent/ CPD, and decision made to proceed with cesarean.  Procedure Details  The patient was taken to Operating Room, identified as the correct patient and the procedure verified as C-Section Delivery. A formal Time Out was held with all team members present and in agreement.  After induction of anesthesia, the patient was draped and prepped in the usual sterile manner. A  Pfannenstiel skin incision was made and carried down through the subcutaneous tissue to the fascia. Fascial incision was made and extended transversely with the Mayo scissors. The fascia was separated from the underlying rectus tissue superiorly and inferiorly. The peritoneum was identified and entered bluntly. Peritoneal incision was extended longitudinally. The utero-vesical peritoneal reflection was incised transversely and a bladder flap was created digitally.   A low transverse hysterotomy was made. The fetus was delivered atraumatically, requiring multiple interventions to deliver head, though no vacuum. The umbilical cord was clamped x2 and cut and the infant was handed to the awaiting pediatricians. The placenta was removed intact and appeared normal, intact, and with a 3-vessel cord.   The uterus was exteriorized and cleared of all clot and debris. The hysterotomy was closed with running sutures of 0-Vicryl. A second imbricating layer was placed with the same suture. Significant bleeding at the angles was noted, and multiple layers were used to obtain hemostasis. The peritoneal cavity was cleared of all clots and debris. The uterus was returned to the abdomen.   Excellent hemostasis was noted. The fascia was then reapproximated with running sutures of 0 Vicryl.  The subcutaneous tissue was reapproximated with running sutures of 0 Vicry. The skin was reapproximated with Ensorb.  61ml (in 30 of 0.5% bupivicaine and 55ml of NSS) of liposomal bupivicaine placed in the fascial and skin lines.  Instrument, sponge, and needle counts were correct prior to the abdominal closure and at the conclusion of the case.   The patient tolerated the procedure well and was  transferred to the recovery room in stable condition.   Christeen Douglas, MD 09/09/2020

## 2020-09-09 NOTE — Anesthesia Procedure Notes (Signed)
Epidural Patient location during procedure: OB Start time: 09/09/2020 3:31 PM End time: 09/09/2020 3:40 PM  Staffing Anesthesiologist: Corinda Gubler, MD Resident/CRNA: Karoline Caldwell, CRNA Performed: resident/CRNA   Preanesthetic Checklist Completed: patient identified, IV checked, site marked, risks and benefits discussed, surgical consent, monitors and equipment checked, pre-op evaluation and timeout performed  Epidural Patient position: sitting Prep: ChloraPrep Patient monitoring: heart rate, continuous pulse ox and blood pressure Approach: midline Location: L3-L4 Injection technique: LOR air  Needle:  Needle type: Tuohy  Needle gauge: 17 G Needle length: 9 cm and 9 Needle insertion depth: 7 cm Catheter type: closed end flexible Catheter size: 19 Gauge Catheter at skin depth: 12 cm Test dose: negative and 1.5% lidocaine with Epi 1:200 K  Assessment Events: blood not aspirated, injection not painful, no injection resistance, no paresthesia and negative IV test  Additional Notes 1 attempt Pt. Evaluated and documentation done after procedure finished. Patient identified. Risks/Benefits/Options discussed with patient including but not limited to bleeding, infection, nerve damage, paralysis, failed block, incomplete pain control, headache, blood pressure changes, nausea, vomiting, reactions to medication both or allergic, itching and postpartum back pain. Confirmed with bedside nurse the patient's most recent platelet count. Confirmed with patient that they are not currently taking any anticoagulation, have any bleeding history or any family history of bleeding disorders. Patient expressed understanding and wished to proceed. All questions were answered. Sterile technique was used throughout the entire procedure. Please see nursing notes for vital signs. Test dose was given through epidural catheter and negative prior to continuing to dose epidural or start infusion. Warning signs of  high block given to the patient including shortness of breath, tingling/numbness in hands, complete motor block, or any concerning symptoms with instructions to call for help. Patient was given instructions on fall risk and not to get out of bed. All questions and concerns addressed with instructions to call with any issues or inadequate analgesia.   Patient tolerated the insertion well without immediate complications.Reason for block:procedure for pain

## 2020-09-10 ENCOUNTER — Encounter: Payer: Self-pay | Admitting: Obstetrics and Gynecology

## 2020-09-10 LAB — CBC
HCT: 36.1 % (ref 36.0–46.0)
Hemoglobin: 12.5 g/dL (ref 12.0–15.0)
MCH: 30.4 pg (ref 26.0–34.0)
MCHC: 34.6 g/dL (ref 30.0–36.0)
MCV: 87.8 fL (ref 80.0–100.0)
Platelets: 221 10*3/uL (ref 150–400)
RBC: 4.11 MIL/uL (ref 3.87–5.11)
RDW: 14.1 % (ref 11.5–15.5)
WBC: 24.7 10*3/uL — ABNORMAL HIGH (ref 4.0–10.5)
nRBC: 0 % (ref 0.0–0.2)

## 2020-09-10 LAB — RPR: RPR Ser Ql: NONREACTIVE

## 2020-09-10 MED ORDER — DIPHENHYDRAMINE HCL 25 MG PO CAPS: 25.0000 mg | ORAL_CAPSULE | Freq: Four times a day (QID) | ORAL | Status: DC | PRN

## 2020-09-10 MED ORDER — OXYCODONE HCL 5 MG PO TABS
5.0000 mg | ORAL_TABLET | ORAL | Status: DC | PRN
  Administered 2020-09-11: 10 mg via ORAL
  Filled 2020-09-10: qty 2

## 2020-09-10 MED ORDER — KETOROLAC TROMETHAMINE 30 MG/ML IJ SOLN
30.0000 mg | Freq: Four times a day (QID) | INTRAMUSCULAR | Status: AC
  Administered 2020-09-10 (×4): 30 mg via INTRAVENOUS
  Filled 2020-09-10 (×4): qty 1

## 2020-09-10 MED ORDER — PRENATAL MULTIVITAMIN CH
1.0000 | ORAL_TABLET | Freq: Every day | ORAL | Status: DC
  Administered 2020-09-10 – 2020-09-11 (×2): 1 via ORAL
  Filled 2020-09-10 (×2): qty 1

## 2020-09-10 MED ORDER — GABAPENTIN 300 MG PO CAPS
300.0000 mg | ORAL_CAPSULE | Freq: Every day | ORAL | Status: DC
  Administered 2020-09-10: 300 mg via ORAL
  Filled 2020-09-10: qty 1

## 2020-09-10 MED ORDER — WITCH HAZEL-GLYCERIN EX PADS: 1.0000 "application " | MEDICATED_PAD | CUTANEOUS | Status: DC | PRN

## 2020-09-10 MED ORDER — MENTHOL 3 MG MT LOZG
1.0000 | LOZENGE | OROMUCOSAL | Status: DC | PRN
  Filled 2020-09-10: qty 9

## 2020-09-10 MED ORDER — SIMETHICONE 80 MG PO CHEW: 80.0000 mg | CHEWABLE_TABLET | ORAL | Status: DC | PRN

## 2020-09-10 MED ORDER — FLEET ENEMA 7-19 GM/118ML RE ENEM: 1.0000 | ENEMA | Freq: Every day | RECTAL | Status: DC | PRN

## 2020-09-10 MED ORDER — BISACODYL 10 MG RE SUPP: 10.0000 mg | Freq: Every day | RECTAL | Status: DC | PRN

## 2020-09-10 MED ORDER — SODIUM CHLORIDE 0.9 % IV SOLN
INTRAVENOUS | Status: DC | PRN
  Administered 2020-09-10: 250 mL via INTRAVENOUS

## 2020-09-10 MED ORDER — OXYTOCIN-SODIUM CHLORIDE 30-0.9 UT/500ML-% IV SOLN: 2.5000 [IU]/h | INTRAVENOUS | Status: AC

## 2020-09-10 MED ORDER — IBUPROFEN 800 MG PO TABS
800.0000 mg | ORAL_TABLET | Freq: Four times a day (QID) | ORAL | Status: DC
  Administered 2020-09-11 (×2): 800 mg via ORAL
  Filled 2020-09-10 (×2): qty 1

## 2020-09-10 MED ORDER — TETANUS-DIPHTH-ACELL PERTUSSIS 5-2.5-18.5 LF-MCG/0.5 IM SUSY: 0.5000 mL | PREFILLED_SYRINGE | Freq: Once | INTRAMUSCULAR | Status: DC

## 2020-09-10 MED ORDER — LACTATED RINGERS IV SOLN: INTRAVENOUS | Status: DC

## 2020-09-10 MED ORDER — SENNOSIDES-DOCUSATE SODIUM 8.6-50 MG PO TABS
2.0000 | ORAL_TABLET | ORAL | Status: DC
  Administered 2020-09-10 – 2020-09-11 (×2): 2 via ORAL
  Filled 2020-09-10 (×2): qty 2

## 2020-09-10 MED ORDER — COCONUT OIL OIL: 1.0000 "application " | TOPICAL_OIL | Status: DC | PRN

## 2020-09-10 MED ORDER — MEASLES, MUMPS & RUBELLA VAC IJ SOLR
0.5000 mL | Freq: Once | INTRAMUSCULAR | Status: AC
  Administered 2020-09-11: 0.5 mL via SUBCUTANEOUS
  Filled 2020-09-10 (×2): qty 0.5

## 2020-09-10 MED ORDER — SIMETHICONE 80 MG PO CHEW
80.0000 mg | CHEWABLE_TABLET | Freq: Three times a day (TID) | ORAL | Status: DC
  Administered 2020-09-10 – 2020-09-11 (×5): 80 mg via ORAL
  Filled 2020-09-10 (×5): qty 1

## 2020-09-10 MED ORDER — ACETAMINOPHEN 500 MG PO TABS
1000.0000 mg | ORAL_TABLET | Freq: Four times a day (QID) | ORAL | Status: DC
  Administered 2020-09-10 – 2020-09-11 (×5): 1000 mg via ORAL
  Filled 2020-09-10 (×6): qty 2

## 2020-09-10 MED ORDER — DIBUCAINE (PERIANAL) 1 % EX OINT: 1.0000 "application " | TOPICAL_OINTMENT | CUTANEOUS | Status: DC | PRN

## 2020-09-10 MED ORDER — FERROUS SULFATE 325 (65 FE) MG PO TABS
325.0000 mg | ORAL_TABLET | Freq: Two times a day (BID) | ORAL | Status: DC
  Administered 2020-09-10: 325 mg via ORAL
  Filled 2020-09-10: qty 1

## 2020-09-10 MED ORDER — CLINDAMYCIN PHOSPHATE 900 MG/50ML IV SOLN
900.0000 mg | Freq: Three times a day (TID) | INTRAVENOUS | Status: AC
  Administered 2020-09-10 – 2020-09-11 (×3): 900 mg via INTRAVENOUS
  Filled 2020-09-10 (×4): qty 50

## 2020-09-10 NOTE — Lactation Note (Addendum)
This note was copied from a baby's chart. Lactation Consultation Note  Patient Name: Veronica Wagner [id number] Date: 09/10/2020 Reason for consult: Follow-up assessment;Primapara Age:31 hours  Maternal Data    Feeding Mother's Current Feeding Choice: Breast Milk Attempts made prior to bath at 1300, baby unable to coordinate latch and suck, fussy, gaggy, now attempt made on left breast in cradle hold, baby unable to get a deep latch, placed in football hold on left, able to position baby to get deep latch and baby is nursing well with only occ stimulation, many swallows heard, nursed 20 min on left, mom  Burped, attempted on right, right breast firmer, massage and hand expression done, baby able to latch after several attempts, nursed approx 5 min off and on, occ swallow, sleepy and came off breast, back to skin to skin, mom to attempt on right breast at next feeding.     LATCH Score Latch: Grasps breast easily, tongue down, lips flanged, rhythmical sucking.  Audible Swallowing: Spontaneous and intermittent  Type of Nipple: Everted at rest and after stimulation  Comfort (Breast/Nipple): Soft / non-tender  Hold (Positioning): Assistance needed to correctly position infant at breast and maintain latch.  LATCH Score: 9   Lactation Tools Discussed/Used    Interventions Interventions: Assisted with latch;Breast feeding basics reviewed;Skin to skin;Hand express;Adjust position;Breast compression;Support pillows  Discharge    Consult Status Consult Status: PRN Date: 09/10/20 Follow-up type: In-patient    Dyann Kief 09/10/2020, 2:52 PM

## 2020-09-10 NOTE — Anesthesia Postprocedure Evaluation (Signed)
Anesthesia Post Note  Patient: Veronica Wagner  Procedure(s) Performed: CESAREAN SECTION  Patient location during evaluation: Mother Baby Anesthesia Type: Epidural Level of consciousness: awake and alert Pain management: pain level controlled Respiratory status: spontaneous breathing Cardiovascular status: blood pressure returned to baseline Anesthetic complications: no   No complications documented.   Last Vitals:  Vitals:   09/10/20 0341 09/10/20 0729  BP: 115/67 (!) 96/57  Pulse: 80 76  Resp: 18 18  Temp: (!) 36.4 C 36.9 C  SpO2:  98%    Last Pain:  Vitals:   09/10/20 0729  TempSrc: Oral  PainSc:                  Jaye Beagle

## 2020-09-10 NOTE — Progress Notes (Signed)
Postop Day  1  Subjective: no complaints and tolerating PO  Doing well, no concerns. Resting in bed. Pain managed with PO and IV meds, tolerating clear liquid diet. Foley remains in situ, draining clear, yellow urine.   No fever/chills, chest pain, shortness of breath, nausea/vomiting, or leg pain. No nipple or breast pain. No headache, visual changes, or RUQ/epigastric pain.  Objective: BP (!) 96/57 (BP Location: Left Arm)   Pulse 76   Temp 98.5 F (36.9 C) (Oral)   Resp 18   Ht $R'5\' 4"'qn$  (1.626 m)   Wt 106.1 kg   LMP 12/11/2019 (Exact Date)   SpO2 98%   Breastfeeding Unknown   BMI 40.17 kg/m    Physical Exam:  General: alert, cooperative and no distress Breasts: soft/nontender CV: RRR Pulm: nl effort, CTABL Abdomen: soft, non-tender, active bowel sounds Uterine Fundus: firm Incision: minimal drainage noted on inferior left portion, dressing not intact on lower portion Perineum: minimal edema, intact Lochia: appropriate DVT Evaluation: No evidence of DVT seen on physical exam.  Recent Labs    09/09/20 1112 09/10/20 0605  HGB 13.4 12.5  HCT 39.3 36.1  WBC 12.6* 24.7*  PLT 208 221    Assessment/Plan: 30 y.o. G1P1001 postpartum day # 1  -Continue routine postpartum care -Lactation consult PRN for breastfeeding  -Reinforce dressing with tape.  Plan to keep occlusive pressure dressing on for 24 hours.  May remove in shower tomorrow and cover with OP Site honeycomb occlusive dressing  -CBC reviewed - hemodynamically stable and asymptomatic; Leukocytosis present.  Will give postpartum antibiotics of clindamycin (PCN allergy) and gentamycin d/t chorioamnionitis and leukocytosis.  -CBC ordered for AM -Immunization status:   needs MMR prior to discharge    Disposition: Continue inpatient postpartum care    LOS: 1 day   Minda Meo, CNM 09/10/2020, 8:51 AM   ----- Drinda Butts  Certified Nurse Midwife [facility]

## 2020-09-10 NOTE — Lactation Note (Signed)
This note was copied from a baby's chart. Lactation Consultation Note  Patient Name: Veronica Wagner [id number] Date: 09/10/2020 Reason for consult: Initial assessment;Primapara;Term Age:31 hours  Maternal Data Has patient been taught Hand Expression?: Yes Does the patient have breastfeeding experience prior to this delivery?: No  Feeding Mother's Current Feeding Choice: Breast Milk BAby has not breastfed well since 0530, mom has not fed baby on right breast since birth, hand expression demonstrated with massage and mom able to express several drops of yellow colostrum from right breast, attempted in football hold, baby would root and open mouth but unable to coordinate tongue to start sucking, several attempts made, baby sl gaggy and has been spitty this am prior to this attempt, baby placed skin to skin on mom's chest and mom will attempt again when baby starts displaying more feeding cues.     LATCH Score Latch: Too sleepy or reluctant, no latch achieved, no sucking elicited.  Audible Swallowing: None  Type of Nipple: Everted at rest and after stimulation  Comfort (Breast/Nipple): Soft / non-tender  Hold (Positioning): Full assist, staff holds infant at breast  LATCH Score: 4   Lactation Tools Discussed/Used  LC name and no written on white board  Interventions Interventions: Breast feeding basics reviewed;Assisted with latch;Skin to skin;Hand express;Breast massage;Adjust position;Support pillows  Discharge Pump: Personal WIC Program: No  Consult Status Consult Status: Follow-up Date: 09/10/20 Follow-up type: In-patient    Dyann Kief 09/10/2020, 10:37 AM

## 2020-09-11 LAB — CBC
HCT: 28.8 % — ABNORMAL LOW (ref 36.0–46.0)
Hemoglobin: 9.7 g/dL — ABNORMAL LOW (ref 12.0–15.0)
MCH: 30.6 pg (ref 26.0–34.0)
MCHC: 33.7 g/dL (ref 30.0–36.0)
MCV: 90.9 fL (ref 80.0–100.0)
Platelets: 190 10*3/uL (ref 150–400)
RBC: 3.17 MIL/uL — ABNORMAL LOW (ref 3.87–5.11)
RDW: 14.6 % (ref 11.5–15.5)
WBC: 17.5 10*3/uL — ABNORMAL HIGH (ref 4.0–10.5)
nRBC: 0 % (ref 0.0–0.2)

## 2020-09-11 MED ORDER — ACETAMINOPHEN 500 MG PO TABS: 1000.0000 mg | ORAL_TABLET | Freq: Four times a day (QID) | ORAL | 0 refills | Status: AC

## 2020-09-11 MED ORDER — SIMETHICONE 80 MG PO CHEW: 80.0000 mg | CHEWABLE_TABLET | Freq: Three times a day (TID) | ORAL | 0 refills | Status: AC

## 2020-09-11 MED ORDER — IBUPROFEN 800 MG PO TABS: 800.0000 mg | ORAL_TABLET | Freq: Three times a day (TID) | ORAL | 1 refills | Status: AC | PRN

## 2020-09-11 MED ORDER — FERROUS SULFATE 325 (65 FE) MG PO TABS: 325.0000 mg | ORAL_TABLET | Freq: Every day | ORAL | 3 refills | Status: AC

## 2020-09-11 MED ORDER — SENNOSIDES-DOCUSATE SODIUM 8.6-50 MG PO TABS: 2.0000 | ORAL_TABLET | ORAL | Status: AC

## 2020-09-11 MED ORDER — OXYCODONE HCL 5 MG PO TABS: 5.0000 mg | ORAL_TABLET | ORAL | 0 refills | Status: AC | PRN

## 2020-09-11 NOTE — Discharge Instructions (Signed)
Cesarean Delivery, Care After Refer to this sheet in the next few weeks. These instructions provide you with information on caring for yourself after your procedure. Your health care provider may also give you specific instructions. Your treatment has been planned according to current medical practices, but problems sometimes occur. Call your health care provider if you have any problems or questions after you go home. HOME CARE INSTRUCTIONS  Please leave honey comb dressing (OP Site) on for 7 days.  You may shower during this period but turn your back to the water so that the dressing does not get directly saturated by the water.   You may take the dressing off on day 7.  The easiest way to do it is in the shower.  Allow the water to run over the dressing and it usually comes off easier.   Only take over-the-counter or prescription medications as directed by your health care provider. Do not drink alcohol, especially if you are breastfeeding or taking medication to relieve pain. Do not  smoke tobacco. Continue to use good perineal care. Good perineal care includes: Wiping your perineum from front to back. Keeping your perineum clean. Check your surgical cut (incision) daily for increased redness, drainage, swelling, or separation of skin. Shower and clean your incision gently with soap and water every day, by letting warm and soapy water run over the incision, and then pat it dry. If your health care provider says it is okay, leave the incision uncovered. Use a bandage (dressing) if the incision is draining fluid or appears irritated. If the adhesive strips across the incision do not fall off within 7 days, carefully peel them off, after a shower. Hug a pillow when coughing or sneezing until your incision is healed. This helps to relieve pain. Do not use tampons, douches or have sexual intercourse, until your health care provider says it is okay. Wear a well-fitting bra that provides breast  support. Limit wearing support panties or control-top hose. Drink enough fluids to keep your urine clear or pale yellow. Eat high-fiber foods such as whole grain cereals and breads, brown rice, beans, and fresh fruits and vegetables every day. These foods may help prevent or relieve constipation. Resume activities such as climbing stairs, driving, lifting, exercising, or traveling as directed by your health care provider. Try to have someone help you with your household activities and your newborn for at least a few days after you leave the hospital. Rest as much as possible. Try to rest or take a nap when your newborn is sleeping. Increase your activities gradually. Do not lift more than 15lbs until directed by a provider. Keep all of your scheduled postpartum appointments. It is very important to keep your scheduled follow-up appointments. At these appointments, your health care provider will be checking to make sure that you are healing physically and emotionally. SEEK MEDICAL CARE IF:  You are passing large clots from your vagina. Save any clots to show your health care provider. You have a foul smelling discharge from your vagina. You have trouble urinating. You are urinating frequently. You have pain when you urinate. You have a change in your bowel movements. You have increasing redness, pain, or swelling near your incision. You have pus draining from your incision. Your incision is separating. You have painful, hard, or reddened breasts. You have a severe headache. You have blurred vision or see spots. You feel sad or depressed. You have thoughts of hurting yourself or your newborn. You have questions   about your care, the care of your newborn, or medications. You are dizzy or light-headed. You have a rash. You have pain, redness, or swelling at the site of the removed intravenous access (IV) tube. You have nausea or vomiting. You stopped breastfeeding and have not had a  menstrual period within 12 weeks of stopping. You are not breastfeeding and have not had a menstrual period within 12 weeks of delivery. You have a fever. SEEK IMMEDIATE MEDICAL CARE IF: You have persistent pain. You have chest pain. You have shortness of breath. You faint. You have leg pain. You have stomach pain. Your vaginal bleeding saturates 2 or more sanitary pads in 1 hour. MAKE SURE YOU:  Understand these instructions. Will watch your condition. Will get help right away if you are not doing well or get worse. Document Released: 02/04/2002 Document Revised: 09/29/2013 Document Reviewed: 01/10/2012 [employer] Patient Information 2015 [employer]. This information is not intended to replace advice given to you by your health care provider. Make sure you discuss any questions you have with your health care provider.  

## 2020-09-11 NOTE — Progress Notes (Signed)
Patient discharged home with infant. Discharge instructions, prescriptions and follow up appointment given to and reviewed with patient. Patient verbalized understanding. Pt wheeled out with infant by auxiliary.  

## 2021-05-09 ENCOUNTER — Ambulatory Visit: Payer: 59 | Attending: Obstetrics and Gynecology

## 2021-05-09 ENCOUNTER — Other Ambulatory Visit: Payer: Self-pay

## 2021-05-09 DIAGNOSIS — M533 Sacrococcygeal disorders, not elsewhere classified: Secondary | ICD-10-CM

## 2021-05-09 DIAGNOSIS — R278 Other lack of coordination: Secondary | ICD-10-CM | POA: Diagnosis present

## 2021-05-09 DIAGNOSIS — M6281 Muscle weakness (generalized): Secondary | ICD-10-CM | POA: Diagnosis present

## 2021-05-09 DIAGNOSIS — N941 Unspecified dyspareunia: Secondary | ICD-10-CM | POA: Diagnosis present

## 2021-05-09 NOTE — Therapy (Signed)
[place] [facility] MAIN [facility] SERVICES [address] [place], [place], [address] Phone: [phone]   Fax:  [phone]  Physical Therapy Evaluation  Patient Details  Name: Veronica Wagner MRN: [id number] Date of Birth: 27-Mar-1991 Referring Provider (PT): Christeen Douglas, MD   Encounter Date: 05/09/2021   PT End of Session - 05/09/21 1451     Visit Number 1    Number of Visits 9    Date for PT Re-Evaluation 07/08/21    Authorization Type UHC    Progress Note Due on Visit 10    PT Start Time 1351   pt early   PT Stop Time 1447    PT Time Calculation (min) 56 min    Activity Tolerance Patient tolerated treatment well    Behavior During Therapy [facility] for tasks assessed/performed             Past Medical History:  Diagnosis Date   Anxiety    Not on medication   Palpitations     Past Surgical History:  Procedure Laterality Date   BREAST SURGERY     Left breast aspiration.    CESAREAN SECTION  09/09/2020   Procedure: CESAREAN SECTION;  Surgeon: Christeen Douglas, MD;  Location: [facility] ORS;  Service: Obstetrics;;   TONSILLECTOMY      There were no vitals filed for this visit.    Subjective Assessment - 05/09/21 1400     Subjective Pt reported dyspareunia began postpartum and has not been able to complete intercourse 2/2 pain. Pt reported pain with insertion and deep penetration. Describes pain as a burning sensation, doesn't matter what lubrication she uses.  Urinary: denied leakage. Pt voids approx. once every 4 hours. Bowel: denied leakage. Pt denied issues with constipation and diarrhea. Pt has approx. one bowel movement per day. 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 Sexual function: pain with intercourse and tenderness during OBGYN exam. Pt denied hx of MVA, accidents or any trauma to coccyx.    Pertinent History Anxiety, hx of c-section with  infection in 08/2020    Patient Stated Goals To be able to have sex with my husband.    Currently in Pain? No/denies                [facility] PT Assessment - 05/09/21 1411       Assessment   Medical Diagnosis Dsypareunia, other specified disorders of muscle    Referring Provider (PT) Christeen Douglas, MD    Onset Date/Surgical Date 03/30/21    Hand Dominance Right    Prior Therapy none      Precautions   Precautions None      Restrictions   Weight Bearing Restrictions No      Balance Screen   Has the patient fallen in the past 6 months No    Has the patient had a decrease in activity level because of a fear of falling?  No    Is the patient reluctant to leave their home because of a fear of falling?  No      Home Environment   Living Environment Private residence    Living Arrangements Spouse/significant other    Available Help at Discharge Family;Available PRN/intermittently    Type of Home House    Home Access Stairs to enter    Entrance Stairs-Number of Steps 3    Entrance Stairs-Rails  None    Home Layout One level    Home Equipment Shower seat      Prior Function   Level of Independence Independent    Vocation Works at home    [employer] stay at home mom    Leisure Going to the gym      Cognition   Overall Cognitive Status Within Functional Limits for tasks assessed                Pelvic Floor Physical Therapy Evaluation and Assessment  Screenings: Red Flags: no Have you had any night sweats? Unexplained weight loss? Saddle anesthesia? Unexplained changes in bowel or bladder changes?    OBJECTIVE  Posture/Observations:  Sitting: feet crossed while sitting at knees Standing: incr. Cx spine and lx spine lordosis, incr. ant. Pelvic rotation     Range of Motion/Flexibilty:  Spine: WFL Hips: B hip IR/ER limited   Strength/MMT:  LE MMT  Knee flex: 4/5 Knee ext: 5/5 Ankle DF: 5/5 LE MMT Left Right  Hip flex:  (L2) 5/5 5/5   Hip ext: 5/5 5/5  Hip abd: 2+/5 2+/5  Hip add: 3+/5 3+/5  Hip IR /5 /5  Hip ER /5 /5     Abdominal:  Palpation: no TTP Diastasis: three fingers width superior to umbilicus0, 2.5 fingers width inf. To umbilicus  Pelvic Floor External Exam: Introitus Appears:  Skin integrity:  Palpation: Cough: Prolapse visible?: Scar mobility: Through clothing: Ischial tuberosities: no TTP Palpation for pelvic floor contraction: trace with glute compensation Coccyx: no TTP        Objective measurements completed on examination: See above findings.     Pelvic Floor Special Questions - 05/09/21 1414     Are you Pregnant or attempting pregnancy? No    Prior Pregnancies Yes    Number of Pregnancies 1    Number of C-Sections 1    Any difficulty with labor and deliveries No   but pt did have an infection of R side of c-section scar   Episiotomy Performed No    Currently Sexually Active Yes    Is this Painful Yes    History of sexually transmitted disease Yes    Marinoff Scale pain interrupts completion    Urinary Leakage No    Urinary urgency No    Urinary frequency once every four hours    Fecal incontinence No    Fluid intake Pt starts off with milk, then water, diet Dr. Reino Kent, diet Mt. Dew, tea, then has water at the end.    Caffeine beverages Diet sodas    Falling out feeling (prolapse) No                    SELF CARE:   PT Education - 05/09/21 1449     Education Details PT educated pt on exam findings, PT POC, frequency and duration. PT educated pt on proper seated and standing posture to decr. tension in LEs, which can impact pelvic floor musculature (PFM) tension 2/2 kinetic chain. PT educated pt on proper toileting posture to fully empty bladder and decr. tension in PFM. PT educated pt on diastatsis recti and how it can impact back pain 2/2 decr. abdominal strength.    Person(s) Educated Patient    Methods Explanation;Demonstration;Verbal cues     Comprehension Verbalized understanding;Returned demonstration;Need further instruction              PT Short Term Goals - 05/09/21 1457  PT SHORT TERM GOAL #1   Title Pt will be IND with HEP to improve strength, posture, balance, flexibility and decr. pain.    Time 4    Period Weeks    Status New    Target Date 06/06/21      PT SHORT TERM GOAL #2   Title Pt will complete FOTO and PT will write goal as indicated.    Time 4    Period Weeks    Status New    Target Date 06/06/21      PT SHORT TERM GOAL #3   Title Pt will demonstrate improved posture (neutral pelvis and decr. lx spine lordosis) and strength of hip musculature over the last two weeks to decr. strain on pelvic floor musculature to reduce pelvic pain.    Time 4    Period Weeks    Status New    Target Date 06/06/21      PT SHORT TERM GOAL #4   Title Pt will verbalize techniques and strategies to improve intimacy with partner and decr. stress prior to OBGYN exam in order to reduce pain during exam and intercourse.    Time 4    Period Weeks    Status New    Target Date 06/06/21               PT Long Term Goals - 05/09/21 1501       PT LONG TERM GOAL #1   Title Pt will demonstrate proper coordination of pelvic floor musculature to contract and relax in order to engage in intercourse and OBGYN exam without pain.    Time 8    Period Weeks    Status New    Target Date 07/04/21      PT LONG TERM GOAL #2   Title Pt will demonstrate and verbalize proper lifting technique and ways to perform household chores with proper technique to not incr. tension in pelvic floor.    Time 8    Period Weeks    Status New    Target Date 07/04/21      PT LONG TERM GOAL #3   Title Assess gait and write goal as indicated.    Time 8    Period Weeks    Status New    Target Date 07/04/21      PT LONG TERM GOAL #4   Title Pt will demonstrate proper engagement of deep core musculature to decr. diastasis recti to <2  fingers width to improve core stability and decr. back pain while caring for her child.    Baseline 3 fingers width 1" superior to umbilicus and 2.5 fingers width 1" inf. to umbilicus    Time 8    Period Weeks    Status New    Target Date 07/04/21                    Plan - 05/09/21 1452     Clinical Impression Statement Pt is a pleasant 30y/o female presenting to OP pelvic health PT for dyspareunia and other specified disorders of muscle. Pt's PMH is significant for the following: Anxiety, hx of c-section with infection in 08/2020. The following impairments were noted upon exam: weakness (knee ext, hip abd/add), postural dysfunction (incr. ant. pelvic tilt, incr. cx and lx spine lordosis), decr. hip ROM (IR/ER), diastatsis recti, impaired flexibility, pelvic pain during OBGYN exam and intercourse, difficulty coordinating pelvic floor muscle contraction without glute max compensation, hypomobility of tx spine and sacrum,  hx of back pain. Pt would benefit from skilled PT to improve the deficits listed above in order to perform ADLs, care for child and for interpersonal relationships.    Personal Factors and Comorbidities Comorbidity 2    Comorbidities Anxiety, hx of c-section with infection in 08/2020    Examination-Activity Limitations Caring for Others;Locomotion Level    Examination-Participation Restrictions Interpersonal Relationship    Stability/Clinical Decision Making Stable/Uncomplicated    Clinical Decision Making Low    Rehab Potential Good    PT Frequency 1x / week    PT Duration 8 weeks    PT Treatment/Interventions ADLs/Self Care Home Management;Biofeedback;Moist Heat;Gait training;Stair training;Functional mobility training;Therapeutic activities;Neuromuscular re-education;Balance training;Patient/family education;Therapeutic exercise;Manual techniques;Joint Manipulations;Spinal Manipulations    PT Next Visit Plan Initated HEP: breathing, scar mobilization, hip IR/ER, hip  piriformis stretch, deep core activation.    Consulted and Agree with Plan of Care Patient             Patient will benefit from skilled therapeutic intervention in order to improve the following deficits and impairments:  Abnormal gait, Decreased balance, Decreased endurance, Hypomobility, Decreased coordination, Decreased strength, Impaired flexibility, Decreased range of motion  Visit Diagnosis: Sacrococcygeal disorders, not elsewhere classified - Plan: PT plan of care cert/re-cert  Other lack of coordination - Plan: PT plan of care cert/re-cert  Muscle weakness (generalized) - Plan: PT plan of care cert/re-cert  Dyspareunia, female - Plan: PT plan of care cert/re-cert     Problem List Patient Active Problem List   Diagnosis Date Noted   Back pain affecting pregnancy in third trimester 09/09/2020   Pelvic pain in pregnancy 05/30/2020   Vaginal bleeding in pregnancy, second trimester 05/30/2020   Supervision of normal pregnancy 02/16/2020   Insomnia 07/23/2019   GAD (generalized anxiety disorder) 12/30/2018   Palpitations 12/30/2018   Other premature beats 04/05/2010    Isabela Nardelli L, PT 05/09/2021, 3:09 PM  [place]  [facility] MAIN [facility] SERVICES [address] [place], [place], [address] Phone: [phone]   Fax:  [phone]  Name: Kimbria Camposano MRN: [id number] Date of Birth: 16-Jun-1990  Zerita Boers, PT,DPT 05/09/21 3:09 PM Phone: [phone] Fax: [phone]

## 2021-05-16 ENCOUNTER — Ambulatory Visit: Payer: 59

## 2021-05-18 ENCOUNTER — Ambulatory Visit: Payer: 59

## 2021-05-31 ENCOUNTER — Other Ambulatory Visit: Payer: Self-pay

## 2021-05-31 ENCOUNTER — Ambulatory Visit: Payer: 59 | Attending: Obstetrics and Gynecology

## 2021-05-31 DIAGNOSIS — M6281 Muscle weakness (generalized): Secondary | ICD-10-CM | POA: Diagnosis present

## 2021-05-31 DIAGNOSIS — M533 Sacrococcygeal disorders, not elsewhere classified: Secondary | ICD-10-CM | POA: Diagnosis present

## 2021-05-31 DIAGNOSIS — N941 Unspecified dyspareunia: Secondary | ICD-10-CM | POA: Insufficient documentation

## 2021-05-31 DIAGNOSIS — R278 Other lack of coordination: Secondary | ICD-10-CM | POA: Insufficient documentation

## 2021-05-31 NOTE — Therapy (Signed)
[facility] MAIN [facility] SERVICES [address] [place], [place], [address] Phone: [phone]   Fax:  [phone]  Physical Therapy Treatment  Patient Details  Name: Veronica Wagner MRN: [id number] Date of Birth: 1991/01/25 Referring Provider (PT): Christeen Douglas, MD   Encounter Date: 05/31/2021   PT End of Session - 05/31/21 1156     Visit Number 2    Number of Visits 9    Date for PT Re-Evaluation 07/08/21    Authorization Type UHC    Progress Note Due on Visit 10    PT Start Time 1058    PT Stop Time 1151    PT Time Calculation (min) 53 min    Activity Tolerance Patient tolerated treatment well;No increased pain    Behavior During Therapy WFL for tasks assessed/performed             Past Medical History:  Diagnosis Date   Anxiety    Not on medication   Palpitations     Past Surgical History:  Procedure Laterality Date   BREAST SURGERY     Left breast aspiration.    CESAREAN SECTION  09/09/2020   Procedure: CESAREAN SECTION;  Surgeon: Christeen Douglas, MD;  Location: [facility] ORS;  Service: Obstetrics;;   TONSILLECTOMY      There were no vitals filed for this visit.   Subjective Assessment - 05/31/21 1101     Subjective Pt reported holiday season was good. Pt reported she hasn't attempted intercourse since last visit.    Pertinent History Anxiety, hx of c-section with infection in 08/2020    Patient Stated Goals To be able to have sex with my husband.    Currently in Pain? No/denies                NMR: Access Code: [email or web address] URL: [email or web address] Date: 05/31/2021 Prepared by: Zerita Boers  Exercises Supine Diaphragmatic Breathing - 1 x daily - 7 x weekly - 1 sets - 5 reps - 2 hold (added body scan and positive affirmation techniques with breath to promote relaxation, as tension can incr. Pelvic floor muscle tension) Clamshell - 1 x daily - 3 x weekly - 3 sets - 10 reps Seated Hip Internal  Rotation AROM with pillow squeezed between knees - 1 x daily - 7 x weekly - 1 sets - 10 reps (pt performed 3x10 reps in session).  Cues and demo for technique to ensure proper technique. Performed with S for safety.  Patient Education Scar Massage               [facility] Adult PT Treatment/Exercise - 05/31/21 1156       Manual Therapy   Manual Therapy Soft tissue mobilization    Manual therapy comments Pt denied pain after manual therapy.    Soft tissue mobilization PT performed scar mobilization to pt's c-section scar. Incr. scar tissues noted over L medial and lateral portions of scar, with pt reporting 1.5/10 pain at R lateral portion of scar. PT then guided pt through self scar mobilization and added to HEP.                     PT Education - 05/31/21 1155     Education Details PT educated pt on the importance of scar mobilizations to decr. adhesions and improve ability to perform pelvic tilts. PT established HEP to improve strength, ROM and scar mobility.    Person(s) Educated Patient    Methods Explanation;Demonstration;Tactile cues;Verbal cues;Handout  Comprehension Verbalized understanding;Returned demonstration;Need further instruction              PT Short Term Goals - 05/09/21 1457       PT SHORT TERM GOAL #1   Title Pt will be IND with HEP to improve strength, posture, balance, flexibility and decr. pain.    Time 4    Period Weeks    Status New    Target Date 06/06/21      PT SHORT TERM GOAL #2   Title Pt will complete FOTO and PT will write goal as indicated.    Time 4    Period Weeks    Status New    Target Date 06/06/21      PT SHORT TERM GOAL #3   Title Pt will demonstrate improved posture (neutral pelvis and decr. lx spine lordosis) and strength of hip musculature over the last two weeks to decr. strain on pelvic floor musculature to reduce pelvic pain.    Time 4    Period Weeks    Status New    Target Date 06/06/21      PT  SHORT TERM GOAL #4   Title Pt will verbalize techniques and strategies to improve intimacy with partner and decr. stress prior to OBGYN exam in order to reduce pain during exam and intercourse.    Time 4    Period Weeks    Status New    Target Date 06/06/21               PT Long Term Goals - 05/09/21 1501       PT LONG TERM GOAL #1   Title Pt will demonstrate proper coordination of pelvic floor musculature to contract and relax in order to engage in intercourse and OBGYN exam without pain.    Time 8    Period Weeks    Status New    Target Date 07/04/21      PT LONG TERM GOAL #2   Title Pt will demonstrate and verbalize proper lifting technique and ways to perform household chores with proper technique to not incr. tension in pelvic floor.    Time 8    Period Weeks    Status New    Target Date 07/04/21      PT LONG TERM GOAL #3   Title Assess gait and write goal as indicated.    Time 8    Period Weeks    Status New    Target Date 07/04/21      PT LONG TERM GOAL #4   Title Pt will demonstrate proper engagement of deep core musculature to decr. diastasis recti to <2 fingers width to improve core stability and decr. back pain while caring for her child.    Baseline 3 fingers width 1" superior to umbilicus and 2.5 fingers width 1" inf. to umbilicus    Time 8    Period Weeks    Status New    Target Date 07/04/21                   Plan - 05/31/21 1201     Clinical Impression Statement Today's skilled session focused on establishing HEP to improve relaxation via diaphragmatic breathing, improve hip strength, and hip IR ROM, along with scar mobilization. Pt demonstrated progress during HEP as she progressed to no cues for proper technique. Pt continues to experience limited B hip ROM, strength, impaired posture (incr. ant. pelvic tilt, incr. lx and cx spine lordosis),  and pelvic pain and would continue to benefit from skilled PT to improve QOL and deficits listed  above. Pt missed several appt's 2/2 scheduling issues, will add visits in 06/2021 to ensure pt receives total visits in POC prn.    Comorbidities Anxiety, hx of c-section with infection in 08/2020    PT Treatment/Interventions ADLs/Self Care Home Management;Biofeedback;Moist Heat;Gait training;Stair training;Functional mobility training;Therapeutic activities;Neuromuscular re-education;Balance training;Patient/family education;Therapeutic exercise;Manual techniques;Joint Manipulations;Spinal Manipulations    PT Next Visit Plan FOTO, Review HEP prn, hip piriformis stretch, deep core activation.    PT Home Exercise Plan [email or web address]    Consulted and Agree with Plan of Care Patient             Patient will benefit from skilled therapeutic intervention in order to improve the following deficits and impairments:  Abnormal gait, Decreased balance, Decreased endurance, Hypomobility, Decreased coordination, Decreased strength, Impaired flexibility, Decreased range of motion  Visit Diagnosis: Muscle weakness (generalized)  Other lack of coordination  Sacrococcygeal disorders, not elsewhere classified  Dyspareunia, female     Problem List Patient Active Problem List   Diagnosis Date Noted   Back pain affecting pregnancy in third trimester 09/09/2020   Pelvic pain in pregnancy 05/30/2020   Vaginal bleeding in pregnancy, second trimester 05/30/2020   Supervision of normal pregnancy 02/16/2020   Insomnia 07/23/2019   GAD (generalized anxiety disorder) 12/30/2018   Palpitations 12/30/2018   Other premature beats 04/05/2010    Lorenza Winkleman L, PT 05/31/2021, 12:06 PM  [place] [facility] MAIN [facility] SERVICES [address] [place], [place], [address] Phone: [phone]   Fax:  [phone]  Name: Veronica Wagner MRN: [id number] Date of Birth: January 20, 1991   Zerita Boers, PT,DPT 05/31/21 12:07 PM Phone: [phone] Fax: [phone]

## 2021-05-31 NOTE — Patient Instructions (Signed)
Access Code: [email or web address] URL: [email or web address] Date: 05/31/2021 Prepared by: Geoffry Paradise  Exercises Supine Diaphragmatic Breathing - 1 x daily - 7 x weekly - 1 sets - 5 reps - 2 hold Clamshell - 1 x daily - 3 x weekly - 3 sets - 10 reps Seated Hip Internal Rotation AROM - 1 x daily - 7 x weekly - 1 sets - 10 reps  Patient Education Scar Massage

## 2021-06-06 ENCOUNTER — Other Ambulatory Visit: Payer: Self-pay

## 2021-06-06 ENCOUNTER — Ambulatory Visit: Payer: 59

## 2021-06-06 DIAGNOSIS — M533 Sacrococcygeal disorders, not elsewhere classified: Secondary | ICD-10-CM

## 2021-06-06 DIAGNOSIS — R278 Other lack of coordination: Secondary | ICD-10-CM

## 2021-06-06 DIAGNOSIS — N941 Unspecified dyspareunia: Secondary | ICD-10-CM

## 2021-06-06 DIAGNOSIS — M6281 Muscle weakness (generalized): Secondary | ICD-10-CM

## 2021-06-06 NOTE — Patient Instructions (Signed)
Access Code: [email or web address] URL: [email or web address] Date: 06/06/2021 Prepared by: Zerita Boers  Exercises Supine Diaphragmatic Breathing - 1 x daily - 7 x weekly - 1 sets - 5 reps - 2 hold Clamshell - 1 x daily - 3 x weekly - 3 sets - 10 reps Seated Hip Internal Rotation AROM - 1 x daily - 7 x weekly - 1 sets - 10 reps Sidelying Open Book - 1 x daily - 7 x weekly - 1 sets - 10 reps Supine Angels - 1 x daily - 7 x weekly - 1 sets - 10 reps Child's Pose Stretch - 1 x daily - 7 x weekly - 1 sets - 3 reps - 30-60 hold Supine Pelvic Tilt - 1 x daily - 7 x weekly - 1 sets - 10 reps Supine Lower Trunk Rotation - 1 x daily - 7 x weekly - 1 sets - 10 reps  Patient Education Scar Massage

## 2021-06-06 NOTE — Therapy (Signed)
[place] MAIN [facility] SERVICES [address] [place], [place], [address] Phone: [phone]   Fax:  [phone]  Physical Therapy Treatment  Patient Details  Name: Veronica Wagner MRN: [id number] Date of Birth: [birth year] Referring Provider (PT): Benjaman Kindler, MD   Encounter Date: 06/06/2021   PT End of Session - 06/06/21 1459     Visit Number 3    Number of Visits 9    Date for PT Re-Evaluation 07/08/21    Authorization Type UHC    Progress Note Due on Visit 10    PT Start Time 1402    PT Stop Time 1455    PT Time Calculation (min) 53 min    Activity Tolerance Patient tolerated treatment well    Behavior During Therapy [facility] for tasks assessed/performed             Past Medical History:  Diagnosis Date   Anxiety    Not on medication   Palpitations     Past Surgical History:  Procedure Laterality Date   BREAST SURGERY     Left breast aspiration.    CESAREAN SECTION  09/09/2020   Procedure: CESAREAN SECTION;  Surgeon: Benjaman Kindler, MD;  Location: [facility] ORS;  Service: Obstetrics;;   TONSILLECTOMY      There were no vitals filed for this visit.   Subjective Assessment - 06/06/21 1405     Subjective Pt denied changes since last visit. No intercourse attempts. HEP is going well. Pt states that massaging scar is still weird but she is able to perform scar mobilization.    Pertinent History Anxiety, hx of c-section with infection in 08/2020    Patient Stated Goals To be able to have sex with my husband.    Currently in Pain? No/denies                 NMR: Access Code: [email or web address] URL: [email or web address] Date: 06/06/2021 Prepared by: Geoffry Paradise  Exercises Supine Diaphragmatic Breathing - 1 x daily - 7 x weekly - 1 sets - 5 reps - 2 hold (x3 sets) Clamshell - 1 x daily - 3 x weekly - 3 sets - 10 reps with yellow band. Seated Hip Internal Rotation AROM - 1 x daily - 7 x weekly - 1 sets - 10 reps  review only Sidelying Open Book - 1 x daily - 7 x weekly - 1 sets - 10 reps B trunk Supine Angels - 1 x daily - 7 x weekly - 1 sets - 10 reps Child's Pose Stretch - 1 x daily - 7 x weekly - 1 sets - 3 reps - 30-60 hold  modified with pillow Supine Pelvic Tilt - 1 x daily - 7 x weekly - 1 sets - 10 reps extensive cues Supine Lower Trunk Rotation - 1 x daily - 7 x weekly - 1 sets - 10 reps B trunk  Cues and demo for proper technique. Performed with S for safety.  Patient Education Scar Massage              [facility] Adult PT Treatment/Exercise - 06/06/21 1415       Manual Therapy   Manual Therapy Soft tissue mobilization;Joint mobilization    Manual therapy comments Pt denied pain after manual therapy.    Joint Mobilization PT performed grade 3 jt. mobs to T4-T7 to improve PA ROM, followed by angels and open book.    Soft tissue mobilization PT performed STM to B hip add and medial  hamstring musculature to decr. trigger points and tension.                     PT Education - 06/06/21 1458     Education Details PT reiterated the importance of scar mobility. PT educated pt on new HEP in order to improve relaxation of pelvic floor musculature (PFM) and why PT performed manual therapy followed by exercises to maintain gains made during session. PT educated pt on the benefits of internal muscle assessment of PFM. Pt agreeable.    Person(s) Educated Patient    Methods Explanation;Demonstration;Tactile cues;Verbal cues;Handout    Comprehension Returned demonstration;Verbalized understanding;Need further instruction              PT Short Term Goals - 05/09/21 1457       PT SHORT TERM GOAL #1   Title Pt will be IND with HEP to improve strength, posture, balance, flexibility and decr. pain.    Time 4    Period Weeks    Status New    Target Date 06/06/21      PT SHORT TERM GOAL #2   Title Pt will complete FOTO and PT will write goal as indicated.    Time 4     Period Weeks    Status New    Target Date 06/06/21      PT SHORT TERM GOAL #3   Title Pt will demonstrate improved posture (neutral pelvis and decr. lx spine lordosis) and strength of hip musculature over the last two weeks to decr. strain on pelvic floor musculature to reduce pelvic pain.    Time 4    Period Weeks    Status New    Target Date 06/06/21      PT SHORT TERM GOAL #4   Title Pt will verbalize techniques and strategies to improve intimacy with partner and decr. stress prior to OBGYN exam in order to reduce pain during exam and intercourse.    Time 4    Period Weeks    Status New    Target Date 06/06/21               PT Long Term Goals - 05/09/21 1501       PT LONG TERM GOAL #1   Title Pt will demonstrate proper coordination of pelvic floor musculature to contract and relax in order to engage in intercourse and OBGYN exam without pain.    Time 8    Period Weeks    Status New    Target Date 07/04/21      PT LONG TERM GOAL #2   Title Pt will demonstrate and verbalize proper lifting technique and ways to perform household chores with proper technique to not incr. tension in pelvic floor.    Time 8    Period Weeks    Status New    Target Date 07/04/21      PT LONG TERM GOAL #3   Title Assess gait and write goal as indicated.    Time 8    Period Weeks    Status New    Target Date 07/04/21      PT LONG TERM GOAL #4   Title Pt will demonstrate proper engagement of deep core musculature to decr. diastasis recti to <2 fingers width to improve core stability and decr. back pain while caring for her child.    Baseline 3 fingers width 1" superior to umbilicus and 2.5 fingers width 1" inf. to umbilicus  Time 8    Period Weeks    Status New    Target Date 07/04/21                   Plan - 06/06/21 1500     Clinical Impression Statement Pt demonstrated strength improvements as she tolerated progression from no band during clamshells to yellow  resistance band. Pt required extensive cues for improved coordination during pelvic tilts likely 2/2 c-section scar limiting mobiity. Pt continues to experience tx spine hypomobility, pelvic pain and decr. flexibliity and strength overall.  STGs delayed for one week 2/2 holiday break caused pt to miss one visit. Therefore, pt would continue to benefit from skilled PT to improve the deficits listed above.    Comorbidities Anxiety, hx of c-section with infection in 08/2020    PT Treatment/Interventions ADLs/Self Care Home Management;Biofeedback;Moist Heat;Gait training;Stair training;Functional mobility training;Therapeutic activities;Neuromuscular re-education;Balance training;Patient/family education;Therapeutic exercise;Manual techniques;Joint Manipulations;Spinal Manipulations    PT Next Visit Plan STGs. FOTO, Review HEP prn, hip piriformis stretch, deep core activation.    PT Home Exercise Plan [email or web address]             Patient will benefit from skilled therapeutic intervention in order to improve the following deficits and impairments:  Abnormal gait, Decreased balance, Decreased endurance, Hypomobility, Decreased coordination, Decreased strength, Impaired flexibility, Decreased range of motion  Visit Diagnosis: Muscle weakness (generalized)  Other lack of coordination  Sacrococcygeal disorders, not elsewhere classified  Dyspareunia, female     Problem List Patient Active Problem List   Diagnosis Date Noted   Back pain affecting pregnancy in third trimester 09/09/2020   Pelvic pain in pregnancy 05/30/2020   Vaginal bleeding in pregnancy, second trimester 05/30/2020   Supervision of normal pregnancy 02/16/2020   Insomnia 07/23/2019   GAD (generalized anxiety disorder) 12/30/2018   Palpitations 12/30/2018   Other premature beats 04/05/2010    Gennett Garcia L, PT 06/06/2021, 3:03 PM  [place] MAIN [facility] SERVICES [address]  [place], [place], [address] Phone: [phone]   Fax:  [phone]  Name: Veronica Wagner MRN: [id number] Date of Birth: 1990/12/16   Geoffry Paradise, PT,DPT 06/06/21 3:04 PM Phone: [phone] Fax: [phone]

## 2021-06-13 ENCOUNTER — Ambulatory Visit: Payer: 59

## 2021-06-13 ENCOUNTER — Other Ambulatory Visit: Payer: Self-pay

## 2021-06-13 DIAGNOSIS — M533 Sacrococcygeal disorders, not elsewhere classified: Secondary | ICD-10-CM

## 2021-06-13 DIAGNOSIS — M6281 Muscle weakness (generalized): Secondary | ICD-10-CM | POA: Diagnosis not present

## 2021-06-13 DIAGNOSIS — N941 Unspecified dyspareunia: Secondary | ICD-10-CM

## 2021-06-13 DIAGNOSIS — R278 Other lack of coordination: Secondary | ICD-10-CM

## 2021-06-13 NOTE — Therapy (Signed)
[place] MAIN [facility] SERVICES [address] [place], [place], [address] Phone: [phone]   Fax:  [phone]  Physical Therapy Treatment  Patient Details  Name: Veronica Wagner MRN: [id number] Date of Birth: 05/22/1991 Referring Provider (PT): Benjaman Kindler, MD   Encounter Date: 06/13/2021   PT End of Session - 06/13/21 1617     Visit Number 4    Number of Visits 9    Date for PT Re-Evaluation 07/08/21    Authorization Type UHC    Progress Note Due on Visit 10    PT Start Time 1400    PT Stop Time 2355   pt completed FOTO [phone]   PT Time Calculation (min) 58 min    Activity Tolerance Patient tolerated treatment well    Behavior During Therapy [facility] for tasks assessed/performed             Past Medical History:  Diagnosis Date   Anxiety    Not on medication   Palpitations     Past Surgical History:  Procedure Laterality Date   BREAST SURGERY     Left breast aspiration.    CESAREAN SECTION  09/09/2020   Procedure: CESAREAN SECTION;  Surgeon: Benjaman Kindler, MD;  Location: [facility] ORS;  Service: Obstetrics;;   TONSILLECTOMY      There were no vitals filed for this visit.   Subjective Assessment - 06/13/21 1402     Subjective Pt reported pain was 7/10 last week while attempting intercourse. Pt used lubrication and foreplay but still had pain. Pt reported HEP is going well.    Pertinent History Anxiety, hx of c-section with infection in 08/2020    Patient Stated Goals To be able to have sex with my husband.    Currently in Pain? No/denies                            Pelvic Floor Special Questions - 06/13/21 1417     Pelvic Floor Internal Exam pt agreeable.    Exam Type Vaginal    Sensation intact    Palpation TTP throught B levator ani    Strength weak squeeze, no lift    Strength # of reps 2    Strength # of seconds 1    Tone hypertonicity noted throughout pelvic floor musculature.                [facility] Adult PT Treatment/Exercise - 06/13/21 1456       Manual Therapy   Manual Therapy Internal Pelvic Floor    Manual therapy comments Pt denied pain after manual therapy. PT and pt could feel slight release with manual therapy.    Internal Pelvic Floor PT performed trigger point release to pelvic floor (B levator ani), pt reported L side pain > R side pain 4/10 on average.            To maintain gains made during session, pt performed butterfly stretch with diaphragmatic breathing for 2 minutes after manual therapy.       SELF CARE:  PT Education - 06/13/21 1458     Education Details PT educated pt on internal muscle assessment and pt agreeable. PT discussed incr. tension in pelvic floor and added additional stretches with diaphragmatic breathing to HEP. PT also educated pt on the importance of using lubrication (coconut oil) and foreplay and to perform breathing/stretches prior to attempting intercourse. PT also educated pt that spouse can accompany  pt to appt's to learn how to perform trigger point release. PT discussed goal progress. PT educated pt on different positions during intercourse to [place]. pain.    Person(s) Educated Patient    Methods Explanation;Demonstration;Tactile cues;Verbal cues;Handout    Comprehension Verbalized understanding;Returned demonstration;Need further instruction              PT Short Term Goals - 06/13/21 1622       PT SHORT TERM GOAL #1   Title Pt will be IND with HEP to improve strength, posture, balance, flexibility and decr. pain.    Time 4    Period Weeks    Status Achieved    Target Date 06/06/21      PT SHORT TERM GOAL #2   Title Pt will complete FOTO and PT will write goal as indicated.    Baseline PFDI pain: 17    Time 4    Period Weeks    Status Achieved    Target Date 06/06/21      PT SHORT TERM GOAL #3   Title Pt will demonstrate improved posture (neutral pelvis and decr. lx spine lordosis) and strength of hip  musculature over the last two weeks to decr. strain on pelvic floor musculature to reduce pelvic pain.    Baseline R hip abd: 3+/5, L hip abd: 4/5, B hip add: 4/5 and incr. ant. pelvic tilt    Time 4    Period Weeks    Status Partially Met    Target Date 06/06/21      PT SHORT TERM GOAL #4   Title Pt will verbalize techniques and strategies to improve intimacy with partner and decr. stress prior to OBGYN exam in order to reduce pain during exam and intercourse.    Time 4    Period Weeks    Status Achieved    Target Date 06/06/21               PT Long Term Goals - 06/13/21 1624       PT LONG TERM GOAL #1   Title Pt will demonstrate proper coordination of pelvic floor musculature to contract and relax in order to engage in intercourse and OBGYN exam without pain.    Time 8    Period Weeks    Status New    Target Date 07/04/21      PT LONG TERM GOAL #2   Title Pt will demonstrate and verbalize proper lifting technique and ways to perform household chores with proper technique to not incr. tension in pelvic floor.    Time 8    Period Weeks    Status New    Target Date 07/04/21      PT LONG TERM GOAL #3   Title Assess gait and write goal as indicated.    Time 8    Period Weeks    Status New    Target Date 07/04/21      PT LONG TERM GOAL #4   Title Pt will demonstrate proper engagement of deep core musculature to decr. diastasis recti to <2 fingers width to improve core stability and decr. back pain while caring for her child.    Baseline 3 fingers width 1" superior to umbilicus and 2.5 fingers width 1" inf. to umbilicus    Time 8    Period Weeks    Status New    Target Date 07/04/21      PT LONG TERM GOAL #5   Title Pt will improve FOTO  score (PFDI pain) to 40 to improve QOL.    Baseline 17    Time 4    Period Weeks    Status New    Target Date 07/04/21                   Plan - 06/13/21 1619     Clinical Impression Statement Pt's FOTO score (pt  answered as if it was prior to PT) is a 17 (PFDI pain) indicating pelvic pain is very limiting and impacting QOL. Pt demonstrated progress as she met STGs 1, 2 and 4. Pt partially met STG 3. PT performed internal muscle assessment of pelvic floor musculature (PFM) and B levator ani hypertonicity was noted. Pt's initial PFM contraction was 1/5 but imporved to 2/5 with cues. Pt unable to lengthen PFM. Pt tolerated PFM manual therapy well and reported decr. pressure and pain after session. Pt continues to experience impaired posture (incr. lx lordosis, ant. pelvic tilt), pelvic pain, impaired coordination, decr. strength and flexibility. Therefore, pt would continue to benefit from skilled PT to improve defictis.    PT Treatment/Interventions ADLs/Self Care Home Management;Biofeedback;Moist Heat;Gait training;Stair training;Functional mobility training;Therapeutic activities;Neuromuscular re-education;Balance training;Patient/family education;Therapeutic exercise;Manual techniques;Joint Manipulations;Spinal Manipulations    PT Next Visit Plan Internal muscle assessment and manual therapy. Review HEP prn, hip piriformis stretch, deep core activation.    PT Home Exercise Plan [email or web address]    Consulted and Agree with Plan of Care Patient             Patient will benefit from skilled therapeutic intervention in order to improve the following deficits and impairments:  Abnormal gait, Decreased balance, Decreased endurance, Hypomobility, Decreased coordination, Decreased strength, Impaired flexibility, Decreased range of motion  Visit Diagnosis: Other lack of coordination  Muscle weakness (generalized)  Sacrococcygeal disorders, not elsewhere classified  Dyspareunia, female     Problem List Patient Active Problem List   Diagnosis Date Noted   Back pain affecting pregnancy in third trimester 09/09/2020   Pelvic pain in pregnancy 05/30/2020   Vaginal bleeding in pregnancy, second trimester  05/30/2020   Supervision of normal pregnancy 02/16/2020   Insomnia 07/23/2019   GAD (generalized anxiety disorder) 12/30/2018   Palpitations 12/30/2018   Other premature beats 04/05/2010    Reino Lybbert L, PT 06/13/2021, 4:26 PM  [place] MAIN [facility] SERVICES [address] [place], [place], [address] Phone: [phone]   Fax:  [phone]  Name: Veronica Wagner MRN: [id number] Date of Birth: [birth year]  Geoffry Paradise, PT,DPT 06/13/21 4:27 PM Phone: [phone] Fax: [phone]

## 2021-06-20 ENCOUNTER — Ambulatory Visit: Payer: 59

## 2021-06-22 ENCOUNTER — Ambulatory Visit: Payer: 59

## 2021-06-22 ENCOUNTER — Other Ambulatory Visit: Payer: Self-pay

## 2021-06-22 DIAGNOSIS — M6281 Muscle weakness (generalized): Secondary | ICD-10-CM | POA: Diagnosis not present

## 2021-06-22 DIAGNOSIS — N941 Unspecified dyspareunia: Secondary | ICD-10-CM

## 2021-06-22 DIAGNOSIS — R278 Other lack of coordination: Secondary | ICD-10-CM

## 2021-06-22 DIAGNOSIS — M533 Sacrococcygeal disorders, not elsewhere classified: Secondary | ICD-10-CM

## 2021-06-22 NOTE — Patient Instructions (Addendum)
Ways to decr. Tension to build up to intercourse and OBGYN exam: Use guided meditation and breath coordination to decr. PFM (pelvic floor muscle) tension. Start by lying on bed with sheet covering you on bed, with clothes on, progress to pants off and then underwear off, while talking with husband to decrease tension and to progress to attempting intercourse later and to try same technique for OBGYN exam.  Access Code: [email or web address] URL: [email or web address] Date: 06/22/2021 Prepared by: Irene Limbo on Knees - 1 x daily - 3 x weekly - 1 sets - 3 reps - 30 hold Side Plank on Knees - 1 x daily - 3 x weekly - 1 sets - 3 reps - 5 hold  Patient Education Scar Massage (discussed)

## 2021-06-22 NOTE — Therapy (Signed)
[place] MAIN [facility] SERVICES [address] [place], [place], [address] Phone: [phone]   Fax:  [phone]  Physical Therapy Treatment  Patient Details  Name: Veronica Wagner MRN: [id number] Date of Birth: August 25, 1990 Referring Provider (PT): Benjaman Kindler, MD   Encounter Date: 06/22/2021   PT End of Session - 06/22/21 1448     Visit Number 5    Number of Visits 9    Date for PT Re-Evaluation 07/08/21    Authorization Type UHC    Progress Note Due on Visit 10    PT Start Time 1404    PT Stop Time 1502    PT Time Calculation (min) 58 min    Activity Tolerance Patient tolerated treatment well    Behavior During Therapy [facility] for tasks assessed/performed             Past Medical History:  Diagnosis Date   Anxiety    Not on medication   Palpitations     Past Surgical History:  Procedure Laterality Date   BREAST SURGERY     Left breast aspiration.    CESAREAN SECTION  09/09/2020   Procedure: CESAREAN SECTION;  Surgeon: Benjaman Kindler, MD;  Location: [facility] ORS;  Service: Obstetrics;;   TONSILLECTOMY      There were no vitals filed for this visit.   Subjective Assessment - 06/22/21 1405     Subjective Pt reported she started back at the gym the last two days, treadmill and stairmaster. She attempted to run but fell at gym but denied injury. Pt has not attempted intercourse since last visit. Pt reported HEP has been going well. Pt denied pain after the internal muscle assessment last visit.    Pertinent History Anxiety, hx of c-section with infection in 08/2020    Patient Stated Goals To be able to have sex with my husband.    Currently in Pain? No/denies             NMR: Access Code: [email or web address] URL: [email or web address] Date: 06/22/2021 Prepared by: Belenda Cruise on Knees - 1 x daily - 3 x weekly - 1 sets - 3 reps - 30 hold Side Plank on Knees - 1 x daily - 3 x weekly - 1 sets - 3 reps - 5  hold Cues and demo for technique. Performed with S for safety.  Self care: Scar Massage (discussed)   Ways to decr. Tension to build up to intercourse and OBGYN exam: Use guided meditation and breath coordination to decr. PFM (pelvic floor muscle) tension. Start by lying on bed with sheet covering you on bed, with clothes on, progress to pants off and then underwear off, while talking with husband to decrease tension and to progress to attempting intercourse later and to try same technique for OBGYN exam.               [facility] Adult PT Treatment/Exercise - 06/22/21 1444       Manual Therapy   Manual Therapy Internal Pelvic Floor    Manual therapy comments Pt denied pain after manual therapy.    Internal Pelvic Floor PT performed trigger point release to pelvic floor (B levator ani), pt reported R side (4.5/10) pain > L side pain 3/10 today. PT also instructed pt on pelvic floor relaxation and contraction and to note the difference and to focus on lengthening/relaxing pelvic floor. Pt then performed meditation focused on breath coordination and lengthening PFM for 5 minutes.  SELF CARE:  PT Education - 06/22/21 1446     Education Details PT educated pt on importance of trying guided meditation and breath coordination to decr. PFM tension. PT educated pt on sheet covering pt on bed, with clothes donned, with pants doffed and then underwear doffed while talking with husband to decr. tension and to progress to attempting intercourse later and to try same technique for OBGYN exam.    Person(s) Educated Patient    Methods Explanation;Demonstration;Tactile cues;Verbal cues;Other (comment)   video   Comprehension Verbalized understanding;Returned demonstration;Need further instruction              PT Short Term Goals - 06/13/21 1622       PT SHORT TERM GOAL #1   Title Pt will be IND with HEP to improve strength, posture, balance, flexibility and decr.  pain.    Time 4    Period Weeks    Status Achieved    Target Date 06/06/21      PT SHORT TERM GOAL #2   Title Pt will complete FOTO and PT will write goal as indicated.    Baseline PFDI pain: 17    Time 4    Period Weeks    Status Achieved    Target Date 06/06/21      PT SHORT TERM GOAL #3   Title Pt will demonstrate improved posture (neutral pelvis and decr. lx spine lordosis) and strength of hip musculature over the last two weeks to decr. strain on pelvic floor musculature to reduce pelvic pain.    Baseline R hip abd: 3+/5, L hip abd: 4/5, B hip add: 4/5 and incr. ant. pelvic tilt    Time 4    Period Weeks    Status Partially Met    Target Date 06/06/21      PT SHORT TERM GOAL #4   Title Pt will verbalize techniques and strategies to improve intimacy with partner and decr. stress prior to OBGYN exam in order to reduce pain during exam and intercourse.    Time 4    Period Weeks    Status Achieved    Target Date 06/06/21               PT Long Term Goals - 06/13/21 1624       PT LONG TERM GOAL #1   Title Pt will demonstrate proper coordination of pelvic floor musculature to contract and relax in order to engage in intercourse and OBGYN exam without pain.    Time 8    Period Weeks    Status New    Target Date 07/04/21      PT LONG TERM GOAL #2   Title Pt will demonstrate and verbalize proper lifting technique and ways to perform household chores with proper technique to not incr. tension in pelvic floor.    Time 8    Period Weeks    Status New    Target Date 07/04/21      PT LONG TERM GOAL #3   Title Assess gait and write goal as indicated.    Time 8    Period Weeks    Status New    Target Date 07/04/21      PT LONG TERM GOAL #4   Title Pt will demonstrate proper engagement of deep core musculature to decr. diastasis recti to <2 fingers width to improve core stability and decr. back pain while caring for her child.    Baseline 3 fingers width 1" superior  to umbilicus and 2.5 fingers width 1" inf. to umbilicus    Time 8    Period Weeks    Status New    Target Date 07/04/21      PT LONG TERM GOAL #5   Title Pt will improve FOTO score (PFDI pain) to 40 to improve QOL.    Baseline 17    Time 4    Period Weeks    Status New    Target Date 07/04/21                   Plan - 06/22/21 1504     Clinical Impression Statement Pt demonstrated improved strength as she was able to progress to plank on knees and side plank on knees with deep core engaged. Pt continues to experience hypertonicity of B pelvic floor muscles (PFM) and had difficulty coordinating relaxation/lengthening of PFM with breath. Pt reported decr. tension of PFM after performing guided meditation with breath coordination. Pt also demonstrating progress as she reported improved sensation and mobilization of c-section scar. Pt would continue to benefit from skilled PT to improve strength, pelvic pain, flexibility and mobility.    PT Treatment/Interventions ADLs/Self Care Home Management;Biofeedback;Moist Heat;Gait training;Stair training;Functional mobility training;Therapeutic activities;Neuromuscular re-education;Balance training;Patient/family education;Therapeutic exercise;Manual techniques;Joint Manipulations;Spinal Manipulations    PT Next Visit Plan Internal muscle assessment and manual therapy. Review HEP prn, hip piriformis stretch, deep core activation.    PT Home Exercise Plan [email or web address]    Consulted and Agree with Plan of Care Patient             Patient will benefit from skilled therapeutic intervention in order to improve the following deficits and impairments:  Abnormal gait, Decreased balance, Decreased endurance, Hypomobility, Decreased coordination, Decreased strength, Impaired flexibility, Decreased range of motion  Visit Diagnosis: Other lack of coordination  Muscle weakness (generalized)  Sacrococcygeal disorders, not elsewhere  classified  Dyspareunia, female     Problem List Patient Active Problem List   Diagnosis Date Noted   Back pain affecting pregnancy in third trimester 09/09/2020   Pelvic pain in pregnancy 05/30/2020   Vaginal bleeding in pregnancy, second trimester 05/30/2020   Supervision of normal pregnancy 02/16/2020   Insomnia 07/23/2019   GAD (generalized anxiety disorder) 12/30/2018   Palpitations 12/30/2018   Other premature beats 04/05/2010    Miller,Jennifer L, PT 06/22/2021, 3:08 PM  [place] MAIN [facility] SERVICES [address] [place], [place], [address] Phone: [phone]   Fax:  [phone]  Name: Veronica Wagner MRN: [id number] Date of Birth: [birth year]   Geoffry Paradise, PT,DPT 06/22/21 3:09 PM Phone: [phone] Fax: [phone]

## 2021-06-27 ENCOUNTER — Other Ambulatory Visit: Payer: Self-pay

## 2021-06-27 ENCOUNTER — Ambulatory Visit: Payer: 59

## 2021-06-27 VITALS — BP 111/70 | HR 80

## 2021-06-27 DIAGNOSIS — R278 Other lack of coordination: Secondary | ICD-10-CM

## 2021-06-27 DIAGNOSIS — M6281 Muscle weakness (generalized): Secondary | ICD-10-CM | POA: Diagnosis not present

## 2021-06-27 DIAGNOSIS — M533 Sacrococcygeal disorders, not elsewhere classified: Secondary | ICD-10-CM

## 2021-06-27 DIAGNOSIS — N941 Unspecified dyspareunia: Secondary | ICD-10-CM

## 2021-06-27 NOTE — Therapy (Signed)
[place] MAIN [facility] SERVICES [address] [place], [place], [address] Phone: [phone]   Fax:  [phone]  Physical Therapy Treatment  Patient Details  Name: Veronica Wagner MRN: [id number] Date of Birth: [birth year] Referring Provider (PT): Benjaman Kindler, MD   Encounter Date: 06/27/2021   PT End of Session - 06/27/21 1620     Visit Number 6    Number of Visits 9    Date for PT Re-Evaluation 07/08/21    Authorization Type UHC    Progress Note Due on Visit 10    PT Start Time 1400    PT Stop Time 1503   session stopped at approx. 1456 2/2 lightheadedness   PT Time Calculation (min) 63 min             Past Medical History:  Diagnosis Date   Anxiety    Not on medication   Palpitations     Past Surgical History:  Procedure Laterality Date   BREAST SURGERY     Left breast aspiration.    CESAREAN SECTION  09/09/2020   Procedure: CESAREAN SECTION;  Surgeon: Benjaman Kindler, MD;  Location: [facility] ORS;  Service: Obstetrics;;   TONSILLECTOMY      Vitals:   06/27/21 1455  BP: 111/70  Pulse: 80  At end of session.   Subjective Assessment - 06/27/21 1402     Subjective Pt reported she went to the gym again today and last Thursday. Pt performed planks, treadmill (30 minutes) and stairmaster (10-15 minutes) again. Side planks are still more challenging. Pt has not attempted intercourse yet. Pt reported she felt groin pain (like when you were pregnant) when lifting R knee (hip flexion) and putting pants on.    Pertinent History Anxiety, hx of c-section with infection in 08/2020    Patient Stated Goals To be able to have sex with my husband.    Currently in Pain? No/denies                NMR: Access Code: [email or web address] URL: [email or web address] Date: 06/27/2021 Prepared by: Geoffry Veronica  Exercises Supine Butterfly Groin Stretch - 1 x daily - 7 x weekly - 1 sets - 3 reps - 30-60 hold Plank on Knees - 1 x daily  - 3 x weekly - 1 sets - 3 reps - 30 hold Side Plank on Knees - 1 x daily - 3 x weekly - 1 sets - 3 reps - 5 hold Cues for proper technique as pt had difficulty engaging core and keeping hips level vs. Elevated 2/2 fatigue. Performed with S for safety.               [facility] Adult PT Treatment/Exercise - 06/27/21 1504       Manual Therapy   Manual Therapy Internal Pelvic Floor;Other (comment)    Manual therapy comments Pt denied pain after manual therapy. Pt did feel lightheaded/nauseated upon sitting up after manual therapy and required seated rest break with B shoulder flex (arm pumps), water and crackers. Pt went to the gym and had [employer] drink prior to appt.    Joint Mobilization PT assessed B ASIS, PSIS, sacrum, coccyx, pubic symphsis, ischial tubs. for any issues (with and without moving pt's LEs in flex/ext/abd. No pain noted.    Internal Pelvic Floor PT performed trigger point release to B levator ani (5/10 on R side pain and 3-4/10 L side pain) with release noted during all trigger point releases. PT educated pt on coordinating inhale  with lengthening pelvic floor muscles x10 reps.                   SELF CARE:  PT Education - 06/27/21 1454     Education Details PT educated pt on the importance of staying hydrated during and after going to the gym. PT discussed f/u with MD re: BP and to check blood glucose based on lightheadedness and nausea upon sitting up after manual therapy. PT educated pt on coordinating inhale with lengthening of PFM. PT educated pt on progress of PFM tension decr. with trigger point release today. PT encouraged pt to attempt sheet exercise with pt's husband prior to next session.    Person(s) Educated Patient    Methods Explanation    Comprehension Verbalized understanding              PT Short Term Goals - 06/13/21 1622       PT SHORT TERM GOAL #1   Title Pt will be IND with HEP to improve strength, posture, balance, flexibility  and decr. pain.    Time 4    Period Weeks    Status Achieved    Target Date 06/06/21      PT SHORT TERM GOAL #2   Title Pt will complete FOTO and PT will write goal as indicated.    Baseline PFDI pain: 17    Time 4    Period Weeks    Status Achieved    Target Date 06/06/21      PT SHORT TERM GOAL #3   Title Pt will demonstrate improved posture (neutral pelvis and decr. lx spine lordosis) and strength of hip musculature over the last two weeks to decr. strain on pelvic floor musculature to reduce pelvic pain.    Baseline R hip abd: 3+/5, L hip abd: 4/5, B hip add: 4/5 and incr. ant. pelvic tilt    Time 4    Period Weeks    Status Partially Met    Target Date 06/06/21      PT SHORT TERM GOAL #4   Title Pt will verbalize techniques and strategies to improve intimacy with partner and decr. stress prior to OBGYN exam in order to reduce pain during exam and intercourse.    Time 4    Period Weeks    Status Achieved    Target Date 06/06/21               PT Long Term Goals - 06/13/21 1624       PT LONG TERM GOAL #1   Title Pt will demonstrate proper coordination of pelvic floor musculature to contract and relax in order to engage in intercourse and OBGYN exam without pain.    Time 8    Period Weeks    Status New    Target Date 07/04/21      PT LONG TERM GOAL #2   Title Pt will demonstrate and verbalize proper lifting technique and ways to perform household chores with proper technique to not incr. tension in pelvic floor.    Time 8    Period Weeks    Status New    Target Date 07/04/21      PT LONG TERM GOAL #3   Title Assess gait and write goal as indicated.    Time 8    Period Weeks    Status New    Target Date 07/04/21      PT LONG TERM GOAL #4   Title Pt will  demonstrate proper engagement of deep core musculature to decr. diastasis recti to <2 fingers width to improve core stability and decr. back pain while caring for her child.    Baseline 3 fingers width 1"  superior to umbilicus and 2.5 fingers width 1" inf. to umbilicus    Time 8    Period Weeks    Status New    Target Date 07/04/21      PT LONG TERM GOAL #5   Title Pt will improve FOTO score (PFDI pain) to 40 to improve QOL.    Baseline 17    Time 4    Period Weeks    Status New    Target Date 07/04/21                   Plan - 06/27/21 1621     Clinical Impression Statement Pt demonstrated significant improvement in pelvic floor musculature (PFM) hypertonicity as she reported decr. pain and tension during manual therapy today and experienced a significant release during trigger point release. Pt had difficulty performing plank and side plank with proper technique likely as she went to gym prior to PT appt. today. Pt was able to coodinate lengthening/relaxation of PFM with cues during internal muscle assessment today. Pt's hips and sacrum/coccyx demonstrated good ROM today, pubic symphysis palpated today 2/2 c/o groin pain but not able to reproduce pain, which is likely 2/2 sheering impact from stairmaster-pt educated to cease for now. Pt did require session stopping early 2/2 lightheadedness/nausea upon sitting up at end of session, likely multifactorial as pt's BP runs low but WNL during session, pt had sugary drink prior to session and went to gym prior to PT. PT encouraged pt to f/u with MD. Pt would continue to benefit from skilled PT to improve pain, strength, ROM, and posture.    PT Treatment/Interventions ADLs/Self Care Home Management;Biofeedback;Moist Heat;Gait training;Stair training;Functional mobility training;Therapeutic activities;Neuromuscular re-education;Balance training;Patient/family education;Therapeutic exercise;Manual techniques;Joint Manipulations;Spinal Manipulations    PT Next Visit Plan Ask about nausea/lightheadedness. Internal muscle assessment and manual therapy. Review HEP prn, hip piriformis stretch, deep core activation.    PT Home Exercise Plan  [email or web address]    Consulted and Agree with Plan of Care Patient             Patient will benefit from skilled therapeutic intervention in order to improve the following deficits and impairments:  Abnormal gait, Decreased balance, Decreased endurance, Hypomobility, Decreased coordination, Decreased strength, Impaired flexibility, Decreased range of motion  Visit Diagnosis: Other lack of coordination  Muscle weakness (generalized)  Sacrococcygeal disorders, not elsewhere classified  Dyspareunia, female     Problem List Patient Active Problem List   Diagnosis Date Noted   Back pain affecting pregnancy in third trimester 09/09/2020   Pelvic pain in pregnancy 05/30/2020   Vaginal bleeding in pregnancy, second trimester 05/30/2020   Supervision of normal pregnancy 02/16/2020   Insomnia 07/23/2019   GAD (generalized anxiety disorder) 12/30/2018   Palpitations 12/30/2018   Other premature beats 04/05/2010    Avik Leoni L, PT 06/27/2021, 4:27 PM  [place] MAIN [facility] SERVICES [address] [place], [place], [address] Phone: [phone]   Fax:  [phone]  Name: Veronica Wagner MRN: [id number] Date of Birth: June 16, 1990   Geoffry Veronica, PT,DPT 06/27/21 4:27 PM Phone: [phone] Fax: [phone]

## 2021-06-27 NOTE — Patient Instructions (Signed)
Access Code: [email or web address] URL: [email or web address] Date: 06/27/2021 Prepared by: Geoffry Paradise  Exercises Supine Butterfly Groin Stretch - 1 x daily - 7 x weekly - 1 sets - 3 reps - 30-60 hold Plank on Knees - 1 x daily - 3 x weekly - 1 sets - 3 reps - 30 hold Side Plank on Knees - 1 x daily - 3 x weekly - 1 sets - 3 reps - 5 hold

## 2021-07-04 ENCOUNTER — Ambulatory Visit: Payer: 59

## 2021-07-05 ENCOUNTER — Other Ambulatory Visit: Payer: Self-pay

## 2021-07-05 ENCOUNTER — Ambulatory Visit: Payer: 59 | Attending: Obstetrics and Gynecology

## 2021-07-05 DIAGNOSIS — M6281 Muscle weakness (generalized): Secondary | ICD-10-CM | POA: Diagnosis present

## 2021-07-05 DIAGNOSIS — N941 Unspecified dyspareunia: Secondary | ICD-10-CM | POA: Diagnosis present

## 2021-07-05 DIAGNOSIS — M533 Sacrococcygeal disorders, not elsewhere classified: Secondary | ICD-10-CM | POA: Diagnosis present

## 2021-07-05 DIAGNOSIS — R278 Other lack of coordination: Secondary | ICD-10-CM | POA: Insufficient documentation

## 2021-07-05 NOTE — Patient Instructions (Signed)
Dilator set 1-4, Intimate Rose: [email or web address]  Do not have caffeine prior to the gym. Make sure to drink water and electrolyte drink with limited sugar. Keep track of when you feel lightheaded/nauseated.

## 2021-07-05 NOTE — Therapy (Signed)
[place] MAIN [facility] SERVICES [address] [place], [place], [address] Phone: [phone]   Fax:  [phone]  Physical Therapy Treatment  Patient Details  Name: Veronica Wagner MRN: [id number] Date of Birth: 1991/05/12 Referring Provider (PT): Benjaman Kindler, MD   Encounter Date: 07/05/2021   PT End of Session - 07/05/21 1110     Visit Number 7    Number of Visits 9    Date for PT Re-Evaluation 07/08/21    Authorization Type UHC    Progress Note Due on Visit 10    PT Start Time 1006    PT Stop Time 1100    PT Time Calculation (min) 54 min    Activity Tolerance Patient tolerated treatment well    Behavior During Therapy [facility] for tasks assessed/performed             Past Medical History:  Diagnosis Date   Anxiety    Not on medication   Palpitations     Past Surgical History:  Procedure Laterality Date   BREAST SURGERY     Left breast aspiration.    CESAREAN SECTION  09/09/2020   Procedure: CESAREAN SECTION;  Surgeon: Benjaman Kindler, MD;  Location: [facility] ORS;  Service: Obstetrics;;   TONSILLECTOMY      There were no vitals filed for this visit.   Subjective Assessment - 07/05/21 1008     Subjective Pt reported she attempted intercourse this weekend, and performed relaxation technique prior to attempting. Pt reported she felt lightheaded for approx. 15 minutes while in seated in lobby and front desk attending to pt prn but was able to drive home. She felt nauseated/lightheaded again after going to the gym.    Pertinent History Anxiety, hx of c-section with infection in 08/2020    Patient Stated Goals To be able to have sex with my husband.    Currently in Pain? No/denies                               [facility] Adult PT Treatment/Exercise - 07/05/21 1102       Manual Therapy   Manual Therapy Internal Pelvic Floor    Manual therapy comments Pt denied pain after manual therapy. Pt did feel lightheaded/nauseated  upon sitting up after manual therapy and required seated rest break with B shoulder flex (arm pumps), water and crackers. Pt went to the gym and had [employer] drink prior to appt.    Internal Pelvic Floor PT performed trigger point release to B levator ani and L OI (0/10 on R side pain but tension noted and 2/10 L side pain) with release noted during all trigger point releases. PT educated pt on coordinating inhale with lengthening pelvic floor muscles x10 reps and body scan.                   Self care:  PT Education - 07/05/21 1108     Education Details PT educated pt on potential use of dilators to build up to intercourse and showed pt different sizes and types of dilators. PT encouraged pt to perform only external stimulation vs. internal vaginal penetration unitl pain subsides. PT encouraged pt to bring partner to sessions. PT encouraged pt to perform meditation and positive affirmations daily to decr. stress and tension.  Do not have caffeine prior to the gym. PT educated pt to drink water and electrolyte drink with limited sugar. Keep track of  when you feel lightheaded/nauseated.              PT Short Term Goals - 06/13/21 1622       PT SHORT TERM GOAL #1   Title Pt will be IND with HEP to improve strength, posture, balance, flexibility and decr. pain.    Time 4    Period Weeks    Status Achieved    Target Date 06/06/21      PT SHORT TERM GOAL #2   Title Pt will complete FOTO and PT will write goal as indicated.    Baseline PFDI pain: 17    Time 4    Period Weeks    Status Achieved    Target Date 06/06/21      PT SHORT TERM GOAL #3   Title Pt will demonstrate improved posture (neutral pelvis and decr. lx spine lordosis) and strength of hip musculature over the last two weeks to decr. strain on pelvic floor musculature to reduce pelvic pain.    Baseline R hip abd: 3+/5, L hip abd: 4/5, B hip add: 4/5 and incr. ant. pelvic tilt    Time 4    Period Weeks    Status  Partially Met    Target Date 06/06/21      PT SHORT TERM GOAL #4   Title Pt will verbalize techniques and strategies to improve intimacy with partner and decr. stress prior to OBGYN exam in order to reduce pain during exam and intercourse.    Time 4    Period Weeks    Status Achieved    Target Date 06/06/21               PT Long Term Goals - 06/13/21 1624       PT LONG TERM GOAL #1   Title Pt will demonstrate proper coordination of pelvic floor musculature to contract and relax in order to engage in intercourse and OBGYN exam without pain.    Time 8    Period Weeks    Status New    Target Date 07/04/21      PT LONG TERM GOAL #2   Title Pt will demonstrate and verbalize proper lifting technique and ways to perform household chores with proper technique to not incr. tension in pelvic floor.    Time 8    Period Weeks    Status New    Target Date 07/04/21      PT LONG TERM GOAL #3   Title Assess gait and write goal as indicated.    Time 8    Period Weeks    Status New    Target Date 07/04/21      PT LONG TERM GOAL #4   Title Pt will demonstrate proper engagement of deep core musculature to decr. diastasis recti to <2 fingers width to improve core stability and decr. back pain while caring for her child.    Baseline 3 fingers width 1" superior to umbilicus and 2.5 fingers width 1" inf. to umbilicus    Time 8    Period Weeks    Status New    Target Date 07/04/21      PT LONG TERM GOAL #5   Title Pt will improve FOTO score (PFDI pain) to 40 to improve QOL.    Baseline 17    Time 4    Period Weeks    Status New    Target Date 07/04/21  Plan - 07/05/21 1110     Clinical Impression Statement Skilled session focused on self care techniques to decr. PFM tension and manual therapy. Pt demonstrated progress as she reported 0/10 pain during R pelvic floor muscle palpation but tension noted, pt's L side PFM tension also less but pt reported 2/10  with palpation. Pt still unable to engage in intercourse. also, pt's lightheadedness/nausea likely 2/2 consuming caffeine prior to going to gym. Therefore, pt would continue to benefit from skilled PT to improve strength, decr. pain, improve posture and flexibility. LTGs pushed back to next week 2/2 missing weeks of PT 2/2 scheduling.    PT Treatment/Interventions ADLs/Self Care Home Management;Biofeedback;Moist Heat;Gait training;Stair training;Functional mobility training;Therapeutic activities;Neuromuscular re-education;Balance training;Patient/family education;Therapeutic exercise;Manual techniques;Joint Manipulations;Spinal Manipulations    PT Next Visit Plan Ask about nausea/lightheadedness. Internal muscle assessment and manual therapy. Review HEP prn, hip piriformis stretch, deep core activation.             Patient will benefit from skilled therapeutic intervention in order to improve the following deficits and impairments:  Abnormal gait, Decreased balance, Decreased endurance, Hypomobility, Decreased coordination, Decreased strength, Impaired flexibility, Decreased range of motion  Visit Diagnosis: Other lack of coordination  Muscle weakness (generalized)  Dyspareunia, female  Sacrococcygeal disorders, not elsewhere classified     Problem List Patient Active Problem List   Diagnosis Date Noted   Back pain affecting pregnancy in third trimester 09/09/2020   Pelvic pain in pregnancy 05/30/2020   Vaginal bleeding in pregnancy, second trimester 05/30/2020   Supervision of normal pregnancy 02/16/2020   Insomnia 07/23/2019   GAD (generalized anxiety disorder) 12/30/2018   Palpitations 12/30/2018   Other premature beats 04/05/2010    Joushua Dugar L, PT 07/05/2021, 11:14 AM  [facility] [address] [place], [place], [address] Phone: [phone]   Fax:  [phone]  Name: Veronica Wagner MRN: [id number] Date of  Birth: 22-Jul-1990   Geoffry Paradise, PT,DPT 07/05/21 11:14 AM Phone: [phone] Fax: [phone]

## 2021-07-11 ENCOUNTER — Other Ambulatory Visit: Payer: Self-pay

## 2021-07-11 ENCOUNTER — Ambulatory Visit: Payer: 59

## 2021-07-11 DIAGNOSIS — M6281 Muscle weakness (generalized): Secondary | ICD-10-CM

## 2021-07-11 DIAGNOSIS — R278 Other lack of coordination: Secondary | ICD-10-CM

## 2021-07-11 DIAGNOSIS — M533 Sacrococcygeal disorders, not elsewhere classified: Secondary | ICD-10-CM

## 2021-07-11 DIAGNOSIS — N941 Unspecified dyspareunia: Secondary | ICD-10-CM

## 2021-07-11 NOTE — Therapy (Signed)
[place] MAIN [facility] SERVICES [address] [place], [place], [address] Phone: [phone]   Fax:  [phone]  Physical Therapy Treatment/re-cert  Patient Details  Name: Veronica Wagner MRN: [id number] Date of Birth: 1990/07/22 Referring Provider (PT): Benjaman Kindler, MD   Encounter Date: 07/11/2021   PT End of Session - 07/11/21 1155     Visit Number 8    Number of Visits 9    Date for PT Re-Evaluation 07/08/21    Authorization Type UHC    Progress Note Due on Visit 10    PT Start Time 1103    PT Stop Time 0940    PT Time Calculation (min) 54 min    Activity Tolerance Patient tolerated treatment well;No increased pain    Behavior During Therapy WFL for tasks assessed/performed             Past Medical History:  Diagnosis Date   Anxiety    Not on medication   Palpitations     Past Surgical History:  Procedure Laterality Date   BREAST SURGERY     Left breast aspiration.    CESAREAN SECTION  09/09/2020   Procedure: CESAREAN SECTION;  Surgeon: Benjaman Kindler, MD;  Location: [facility] ORS;  Service: Obstetrics;;   TONSILLECTOMY      There were no vitals filed for this visit.   Subjective Assessment - 07/11/21 1106     Subjective Pt reported her and her family had a GI bug Thursday/Friday last week but feels better today. Therefore, last time pt went to the gym was Thursday 2/2 illness. Pt has been able to perform HEP and ordered dilators (types 1-4). Pt's husband unable to come today 2/2 travel. Pt has not performed sheet exercise since last attempt at intercourse.    Pertinent History Anxiety, hx of c-section with infection in 08/2020    Patient Stated Goals To be able to have sex with my husband.    Currently in Pain? No/denies                               [facility] Adult PT Treatment/Exercise - 07/11/21 1148       Manual Therapy   Manual Therapy Internal Pelvic Floor    Manual therapy comments Pt denied  pain after manual therapy. PT then had pt perform B hip add. stretch in supine with diaphragmatic breathing while gently "bulging" Pelvic floor musculature (PFM) with inhalation to promote relaxation after manual therapy.    Internal Pelvic Floor PT performed trigger point release to B levator ani and Wagner OI (3/10 on R side  tension noted and 0/10 Wagner side tension) with release noted during all trigger point releases. PT educated pt on coordinating inhale with lengthening pelvic floor muscles x5 reps, performed with and without PFM contraction.                   SELF CARE:  PT Education - 07/11/21 1153     Education Details PT educated pt on LTG progress and renewal process. PT reiterated the importance of performing sheet exercise alone first and to progress to practicing with spouse. PT reiterated the importance of performing household chores with core engaged and lifting technique. PT educated pt on PFM relaxation during add. stretch.    Person(s) Educated Patient    Methods Explanation;Verbal cues    Comprehension Returned demonstration;Verbalized understanding;Need further instruction  PT Short Term Goals - 07/11/21 1221       PT SHORT TERM GOAL #1   Title Pt will be IND with HEP to improve strength, posture, balance, flexibility and decr. pain.    Time 4    Period Weeks    Status Achieved    Target Date 06/06/21      PT SHORT TERM GOAL #2   Title Pt will complete FOTO and PT will write goal as indicated.    Baseline PFDI pain: 17    Time 4    Period Weeks    Status Achieved    Target Date 06/06/21      PT SHORT TERM GOAL #3   Title Pt will demonstrate improved posture (neutral pelvis and decr. lx spine lordosis) and strength of hip musculature over the last two weeks to decr. strain on pelvic floor musculature to reduce pelvic pain. ALL UNMET STGS CARRIED OVER TO NEW POC: 08/08/21    Baseline R hip abd: 3+/5, Wagner hip abd: 4/5, B hip add: 4/5 and incr. ant.  pelvic tilt    Time 4    Period Weeks    Status On-going    Target Date 08/08/21      PT SHORT TERM GOAL #4   Title Pt will verbalize techniques and strategies to improve intimacy with partner and decr. stress prior to OBGYN exam in order to reduce pain during exam and intercourse.    Time 4    Period Weeks    Status Achieved    Target Date 06/06/21      PT SHORT TERM GOAL #5   Title Pt will report going to the gym three days a week without incr. in hip pain in order to maintain gains made during PT.    Time 4    Period Weeks    Status New    Target Date 08/08/21               PT Long Term Goals - 07/11/21 1111       PT LONG TERM GOAL #1   Title Pt will demonstrate proper coordination of pelvic floor musculature to contract and relax in order to engage in intercourse and OBGYN exam without pain. ALL UNMET LTGS CARRIED OVER TO NEW POC: 09/09/21    Time 8    Period Weeks    Status Not Met    Target Date 07/04/21   NEW POC: 09/09/21     PT LONG TERM GOAL #2   Title Pt will demonstrate and verbalize proper lifting technique and ways to perform household chores with proper technique to not incr. tension in pelvic floor.    Time 8    Period Weeks    Status Partially Met    Target Date 07/04/21      PT LONG TERM GOAL #3   Title Assess gait and write goal as indicated.    Time 8    Period Weeks    Status Achieved    Target Date 07/04/21      PT LONG TERM GOAL #4   Title Pt will demonstrate proper engagement of deep core musculature to decr. diastasis recti to <2 fingers width to improve core stability and decr. back pain while caring for her child.    Baseline 3 fingers width 1" superior to umbilicus and 2.5 fingers width 1" inf. to umbilicus, 07/11/21: 2.5 fingers width 1" sup. to umbilicus and 2 fingers width 1" inf. to umbilicus.  Time 8    Period Weeks    Status Partially Met    Target Date 07/04/21      PT LONG TERM GOAL #5   Title Pt will improve FOTO score  (PFDI pain) to 40 to improve QOL.    Baseline 17    Time 4    Period Weeks    Status Deferred   will wait to assess at d/c   Target Date 07/04/21                   Plan - 07/11/21 1200     Clinical Impression Statement Pt demonstrated progress as she partially met LTGs 2 and 4, pt met LTG 3. Pt continues to experience pain with intercourse and incr. tension of PFM with TTP. Pt did progress to being able to relax and "bulge" PFM with cues indicating improved coordination. Pt would continue to benefit from skilled PT to improve pain, strength, ROM and flexibility in order to improve QOL and posture during all ADLs to decr. pain. Therefore, PT requesting additional 1x/week for 8weeks.    PT Frequency 1x / week    PT Duration 8 weeks   additional 8 weeks   PT Treatment/Interventions ADLs/Self Care Home Management;Biofeedback;Moist Heat;Gait training;Stair training;Functional mobility training;Therapeutic activities;Neuromuscular re-education;Balance training;Patient/family education;Therapeutic exercise;Manual techniques;Joint Manipulations;Spinal Manipulations    PT Next Visit Plan Internal muscle assessment and manual therapy. Review HEP prn, hip piriformis stretch, deep core activation.    PT Home Exercise Plan [id number]    Consulted and Agree with Plan of Care Patient             Patient will benefit from skilled therapeutic intervention in order to improve the following deficits and impairments:  Abnormal gait, Decreased balance, Decreased endurance, Hypomobility, Decreased coordination, Decreased strength, Impaired flexibility, Decreased range of motion  Visit Diagnosis: Other lack of coordination - Plan: PT plan of care cert/re-cert  Muscle weakness (generalized) - Plan: PT plan of care cert/re-cert  Sacrococcygeal disorders, not elsewhere classified - Plan: PT plan of care cert/re-cert  Dyspareunia, female - Plan: PT plan of care cert/re-cert     Problem  List Patient Active Problem List   Diagnosis Date Noted   Back pain affecting pregnancy in third trimester 09/09/2020   Pelvic pain in pregnancy 05/30/2020   Vaginal bleeding in pregnancy, second trimester 05/30/2020   Supervision of normal pregnancy 02/16/2020   Insomnia 07/23/2019   GAD (generalized anxiety disorder) 12/30/2018   Palpitations 12/30/2018   Other premature beats 04/05/2010    Veronica Wagner, PT 07/11/2021, 12:25 PM  [facility] [address] [place], [place], [address] Phone: [phone]   Fax:  [phone]  Name: Veronica Wagner MRN: [id number] Date of Birth: 1990-12-22   Geoffry Paradise, PT,DPT 07/11/21 12:26 PM Phone: [phone] Fax: [phone]

## 2021-07-18 ENCOUNTER — Other Ambulatory Visit: Payer: Self-pay

## 2021-07-18 ENCOUNTER — Ambulatory Visit: Payer: 59

## 2021-07-18 DIAGNOSIS — R278 Other lack of coordination: Secondary | ICD-10-CM

## 2021-07-18 DIAGNOSIS — N941 Unspecified dyspareunia: Secondary | ICD-10-CM

## 2021-07-18 DIAGNOSIS — M533 Sacrococcygeal disorders, not elsewhere classified: Secondary | ICD-10-CM

## 2021-07-18 DIAGNOSIS — M6281 Muscle weakness (generalized): Secondary | ICD-10-CM

## 2021-07-18 NOTE — Patient Instructions (Signed)
Access Code: [email or web address] URL: [email or web address] Date: 07/18/2021 Prepared by: Geoffry Paradise  Exercises Supine Diaphragmatic Breathing - 1 x daily - 7 x weekly - 1 sets - 5 reps - 2 hold during dilator use Clamshell - 1 x daily - 3 x weekly - 3 sets - 10 reps Supine Lower Trunk Rotation - 1 x daily - 7 x weekly - 1 sets - 10 reps after dead bug Dead Bug feet on mat- 1 x daily - 3 x weekly - 1 sets - 10 reps Dead Bug hip/knees at 90 degrees off mat- 1 x daily - 3 x weekly - 4 sets - 5 reps Seated and standing Pelvic Tilt - 1 x daily - 7 x weekly - 1 sets - 10 reps/position

## 2021-07-18 NOTE — Therapy (Signed)
[place] MAIN [facility] SERVICES [address] [place], [place], [address] Phone: [phone]   Fax:  [phone]  Physical Therapy Treatment  Patient Details  Name: Marshella Tello MRN: [id number] Date of Birth: 17-Feb-1991 Referring Provider (PT): Benjaman Kindler, MD   Encounter Date: 07/18/2021   PT End of Session - 07/18/21 1211     Visit Number 9    Number of Visits 17    Date for PT Re-Evaluation 07/08/21    Authorization Type UHC    Progress Note Due on Visit 10    PT Start Time 1100    PT Stop Time 1156    PT Time Calculation (min) 56 min    Activity Tolerance Patient tolerated treatment well;No increased pain    Behavior During Therapy WFL for tasks assessed/performed             Past Medical History:  Diagnosis Date   Anxiety    Not on medication   Palpitations     Past Surgical History:  Procedure Laterality Date   BREAST SURGERY     Left breast aspiration.    CESAREAN SECTION  09/09/2020   Procedure: CESAREAN SECTION;  Surgeon: Benjaman Kindler, MD;  Location: [facility] ORS;  Service: Obstetrics;;   TONSILLECTOMY      There were no vitals filed for this visit.   Subjective Assessment - 07/18/21 1102     Subjective Pt reported she hasn't worked out since virus as last visit was busy. She feels better today. Pt has still been performing HEP. Pt brought in dilators. Pt attempted using one dilators (type 1), used lubrication, not painful but it kept sliding out, attempted for approx. 5 minutes. Pt has not attempted sheet exercise yet.    Pertinent History Anxiety, hx of c-section with infection in 08/2020    Patient Stated Goals To be able to have sex with my husband.    Currently in Pain? No/denies             NMR: Access Code: [email or web address] URL: [email or web address] Date: 07/18/2021 Prepared by: Geoffry Paradise  Exercises Supine Diaphragmatic Breathing - 1 x daily - 7 x weekly - 1 sets - 5 reps - 2 hold  during dilator use Clamshell - 1 x daily - 3 x weekly - 3 sets - 10 reps Supine Lower Trunk Rotation - 1 x daily - 7 x weekly - 1 sets - 10 reps after dead bug Dead Bug feet on mat- 1 x daily - 3 x weekly - 1 sets - 10 reps Dead Bug hip/knees at 90 degrees off mat- 1 x daily - 3 x weekly - 4 sets - 5 reps Seated and standing Pelvic Tilt - 1 x daily - 7 x weekly - 1 sets - 10 reps/position   Cues and demo for proper technique. S for safety. Rest breaks 2/2 core fatigue during dead bugs.                  [facility] Adult PT Treatment/Exercise - 07/18/21 1215       Manual Therapy   Manual Therapy Internal Pelvic Floor    Manual therapy comments Pt denied pain after manual therapy. PT then had pt perform B hip add. stretch in supine with diaphragmatic breathing while gently "bulging" Pelvic floor musculature (PFM) with inhalation to promote relaxation after manual therapy.    Internal Pelvic Floor PT performed trigger point release to R levator ani briefly in order to trial dilators today.  Pt reported incr. tension on R side vs. L side today.                   SELF CARE:  PT Education - 07/18/21 1209     Education Details PT educated pt on progression of HEP. PT educated pt on how to use dilators-type 3 size for 5 minutes daily with intermittent reps of ant/post, side to side and circular motions to decr. pelvic pain. PT also educated pt on how to use to decr. internal muscle trigger points and that she have spouse assist.    Person(s) Educated Patient    Methods Explanation;Demonstration;Tactile cues;Verbal cues;Handout    Comprehension Need further instruction;Returned demonstration;Verbalized understanding              PT Short Term Goals - 07/11/21 1221       PT SHORT TERM GOAL #1   Title Pt will be IND with HEP to improve strength, posture, balance, flexibility and decr. pain.    Time 4    Period Weeks    Status Achieved    Target Date 06/06/21      PT  SHORT TERM GOAL #2   Title Pt will complete FOTO and PT will write goal as indicated.    Baseline PFDI pain: 17    Time 4    Period Weeks    Status Achieved    Target Date 06/06/21      PT SHORT TERM GOAL #3   Title Pt will demonstrate improved posture (neutral pelvis and decr. lx spine lordosis) and strength of hip musculature over the last two weeks to decr. strain on pelvic floor musculature to reduce pelvic pain. ALL UNMET STGS CARRIED OVER TO NEW POC: 08/08/21    Baseline R hip abd: 3+/5, L hip abd: 4/5, B hip add: 4/5 and incr. ant. pelvic tilt    Time 4    Period Weeks    Status On-going    Target Date 08/08/21      PT SHORT TERM GOAL #4   Title Pt will verbalize techniques and strategies to improve intimacy with partner and decr. stress prior to OBGYN exam in order to reduce pain during exam and intercourse.    Time 4    Period Weeks    Status Achieved    Target Date 06/06/21      PT SHORT TERM GOAL #5   Title Pt will report going to the gym three days a week without incr. in hip pain in order to maintain gains made during PT.    Time 4    Period Weeks    Status New    Target Date 08/08/21               PT Long Term Goals - 07/11/21 1111       PT LONG TERM GOAL #1   Title Pt will demonstrate proper coordination of pelvic floor musculature to contract and relax in order to engage in intercourse and OBGYN exam without pain. ALL UNMET LTGS CARRIED OVER TO NEW POC: 09/09/21    Time 8    Period Weeks    Status Not Met    Target Date 07/04/21   NEW POC: 09/09/21     PT LONG TERM GOAL #2   Title Pt will demonstrate and verbalize proper lifting technique and ways to perform household chores with proper technique to not incr. tension in pelvic floor.    Time 8    Period  Weeks    Status Partially Met    Target Date 07/04/21      PT LONG TERM GOAL #3   Title Assess gait and write goal as indicated.    Time 8    Period Weeks    Status Achieved    Target Date  07/04/21      PT LONG TERM GOAL #4   Title Pt will demonstrate proper engagement of deep core musculature to decr. diastasis recti to <2 fingers width to improve core stability and decr. back pain while caring for her child.    Baseline 3 fingers width 1" superior to umbilicus and 2.5 fingers width 1" inf. to umbilicus, 9/62/22: 2.5 fingers width 1" sup. to umbilicus and 2 fingers width 1" inf. to umbilicus.    Time 8    Period Weeks    Status Partially Met    Target Date 07/04/21      PT LONG TERM GOAL #5   Title Pt will improve FOTO score (PFDI pain) to 40 to improve QOL.    Baseline 17    Time 4    Period Weeks    Status Deferred   will wait to assess at d/c   Target Date 07/04/21                   Plan - 07/18/21 1211     Clinical Impression Statement Pt demonstrated improvements as less tension noted along B OI, B levator ani during internal muscle assessment. Pt also able to coordinate relxation of pelvic floor musculature (PFM) with breath to decr. pain. Pt also able to successfully perform dilator activities with cues and could progress from type 1 (smallest size) to type 3 during session. Pt required rest breaks and frequency cues to engage deep core muscles during dead bug activity 2/2 weakness. Pt unable to progress from supine to standing pelvic tilts 2/2 decr. coordination but able to progress to seated pelvic tilts. Pt would continue to benefit from skilled PT to improve pelvic pain, strength, ROM, flexibility and QOL.    PT Treatment/Interventions ADLs/Self Care Home Management;Biofeedback;Moist Heat;Gait training;Stair training;Functional mobility training;Therapeutic activities;Neuromuscular re-education;Balance training;Patient/family education;Therapeutic exercise;Manual techniques;Joint Manipulations;Spinal Manipulations    PT Next Visit Plan Internal muscle assessment and manual therapy. Review HEP prn, hip piriformis stretch, deep core activation.    Consulted  and Agree with Plan of Care Patient             Patient will benefit from skilled therapeutic intervention in order to improve the following deficits and impairments:  Abnormal gait, Decreased balance, Decreased endurance, Hypomobility, Decreased coordination, Decreased strength, Impaired flexibility, Decreased range of motion  Visit Diagnosis: Other lack of coordination  Muscle weakness (generalized)  Sacrococcygeal disorders, not elsewhere classified  Dyspareunia, female     Problem List Patient Active Problem List   Diagnosis Date Noted   Back pain affecting pregnancy in third trimester 09/09/2020   Pelvic pain in pregnancy 05/30/2020   Vaginal bleeding in pregnancy, second trimester 05/30/2020   Supervision of normal pregnancy 02/16/2020   Insomnia 07/23/2019   GAD (generalized anxiety disorder) 12/30/2018   Palpitations 12/30/2018   Other premature beats 04/05/2010    Randall Rampersad L, PT 07/18/2021, 12:17 PM  [place] MAIN [facility] SERVICES [address] [place], [place], [address] Phone: [phone]   Fax:  [phone]  Name: Saphyra Hutt MRN: [id number] Date of Birth: [birth year]   Geoffry Paradise, PT,DPT 07/18/21 12:18 PM Phone: [phone] Fax: [phone]

## 2021-07-25 ENCOUNTER — Other Ambulatory Visit: Payer: Self-pay

## 2021-07-25 ENCOUNTER — Ambulatory Visit: Payer: 59

## 2021-07-25 DIAGNOSIS — R278 Other lack of coordination: Secondary | ICD-10-CM

## 2021-07-25 DIAGNOSIS — M6281 Muscle weakness (generalized): Secondary | ICD-10-CM

## 2021-07-25 DIAGNOSIS — N941 Unspecified dyspareunia: Secondary | ICD-10-CM

## 2021-07-25 DIAGNOSIS — M533 Sacrococcygeal disorders, not elsewhere classified: Secondary | ICD-10-CM

## 2021-07-25 NOTE — Patient Instructions (Addendum)
Access Code: [email or web address] URL: [email or web address] Date: 07/25/2021 Prepared by: Geoffry Paradise  Exercises Clamshell - 1 x daily - 3 x weekly - 3 sets - 10 reps Supine Lower Trunk Rotation - 1 x daily - 7 x weekly - 1 sets - 10 reps Dead Bug - 1 x daily - 3 x weekly - 4 sets - 5 reps Seated Pelvic Tilt - 1 x daily - 7 x weekly - 1 sets - 10 reps Sidelying Hip Abduction - 1 x daily - 3 x weekly - 2 sets - 10 reps Sidelying Diagonal Hip Abduction - 1 x daily - 3 x weekly - 2 sets - 10 reps Standing Pelvic Tilt - 1 x daily - 7 x weekly - 1 sets - 10 reps  Patient Education Scar Massage

## 2021-07-25 NOTE — Therapy (Signed)
[place] MAIN [facility] SERVICES [address] [place], [place], [address] Phone: [phone]   Fax:  [phone]  Physical Therapy Treatment/Progress note  Patient Details  Name: Veronica [place] MRN: [id number] Date of Birth: 06/26/1990 Referring Provider (PT): Benjaman Kindler, MD  Progress Note Reporting Period 05/09/22  to 07/25/21  See note below for Objective Data and Assessment of Progress/Goals.     Encounter Date: 07/25/2021   PT End of Session - 07/25/21 1208     Visit Number 10    Number of Visits 17    Date for PT Re-Evaluation 07/08/21    Authorization Type UHC    Progress Note Due on Visit 10    PT Start Time 1102    PT Stop Time 1155    PT Time Calculation (min) 53 min    Activity Tolerance Patient tolerated treatment well;No increased pain    Behavior During Therapy WFL for tasks assessed/performed             Past Medical History:  Diagnosis Date   Anxiety    Not on medication   Palpitations     Past Surgical History:  Procedure Laterality Date   BREAST SURGERY     Left breast aspiration.    CESAREAN SECTION  09/09/2020   Procedure: CESAREAN SECTION;  Surgeon: Benjaman Kindler, MD;  Location: [facility] ORS;  Service: Obstetrics;;   TONSILLECTOMY      There were no vitals filed for this visit.   Subjective Assessment - 07/25/21 1104     Subjective Pt reported she had a good week but didn't do anything this week (HEP or the gym). She stated it was just a relaxing week, no pain, just focused on family. She's been taking her son, Loletha Grayer, to the park. Pt did ask about a bill and PT directed pt to billing dept. Pt did not use dilators this last week.    Pertinent History Anxiety, hx of c-section with infection in 08/2020    Patient Stated Goals To be able to have sex with my husband.    Currently in Pain? No/denies                      NMR: Access Code: [email or web address] URL:  [email or web address] Date: 07/25/2021 Prepared by: Geoffry Paradise  Exercises Clamshell - 1 x daily - 3 x weekly - 3 sets - 10 reps Supine Lower Trunk Rotation - 1 x daily - 7 x weekly - 1 sets - 10 reps Dead Bug - 1 x daily - 3 x weekly - 4 sets - 5 reps Seated Pelvic Tilt - 1 x daily - 7 x weekly - 1 sets - 10 reps Sidelying Hip Abduction - 1 x daily - 3 x weekly - 2 sets - 10 reps Sidelying Diagonal Hip Abduction - 1 x daily - 3 x weekly - 2 sets - 10 reps Standing Pelvic Tilt - 1 x daily - 7 x weekly - 1 sets - 10 reps Cues and demo for proper technique. Rest between sets 2/2 fatigue. Performed with S for safety.  Patient Education Scar Massage-reiterated the importance of massage.         [facility] Adult PT Treatment/Exercise - 07/25/21 1208       Manual Therapy   Manual Therapy Internal Pelvic Floor    Manual therapy comments Pt denied pain after manual therapy. PT then had pt perform B hip add. stretch in supine with diaphragmatic breathing while  gently "bulging" Pelvic floor musculature (PFM) with inhalation to promote relaxation after manual therapy.    Internal Pelvic Floor PT performed trigger point release to B levator ani, R OI, L OI and L coccygeus to decr. tension.  Pt able to perform relaxation of PFM after contraction.                   Self care:  PT Education - 07/25/21 1207     Education Details PT educated pt on progression of HEP and holding new activities until next visit. PT educated pt on use of foam roller on hip musculature if sore after today's session. PT educated pt on relaxing pelvic floor musculature (PFM) during inhalations throughout the day to decr. tension.    Person(s) Educated Patient    Methods Explanation;Demonstration;Tactile cues;Verbal cues    Comprehension Returned demonstration;Verbalized understanding;Need further instruction              PT Short Term Goals - 07/11/21 1221       PT SHORT TERM GOAL #1    Title Pt will be IND with HEP to improve strength, posture, balance, flexibility and decr. pain.    Time 4    Period Weeks    Status Achieved    Target Date 06/06/21      PT SHORT TERM GOAL #2   Title Pt will complete FOTO and PT will write goal as indicated.    Baseline PFDI pain: 17    Time 4    Period Weeks    Status Achieved    Target Date 06/06/21      PT SHORT TERM GOAL #3   Title Pt will demonstrate improved posture (neutral pelvis and decr. lx spine lordosis) and strength of hip musculature over the last two weeks to decr. strain on pelvic floor musculature to reduce pelvic pain. ALL UNMET STGS CARRIED OVER TO NEW POC: 08/08/21    Baseline R hip abd: 3+/5, L hip abd: 4/5, B hip add: 4/5 and incr. ant. pelvic tilt    Time 4    Period Weeks    Status On-going    Target Date 08/08/21      PT SHORT TERM GOAL #4   Title Pt will verbalize techniques and strategies to improve intimacy with partner and decr. stress prior to OBGYN exam in order to reduce pain during exam and intercourse.    Time 4    Period Weeks    Status Achieved    Target Date 06/06/21      PT SHORT TERM GOAL #5   Title Pt will report going to the gym three days a week without incr. in hip pain in order to maintain gains made during PT.    Time 4    Period Weeks    Status New    Target Date 08/08/21               PT Long Term Goals - 07/25/21 1114       PT LONG TERM GOAL #1   Title Pt will demonstrate proper coordination of pelvic floor musculature to contract and relax in order to engage in intercourse and OBGYN exam without pain. ALL UNMET LTGS CARRIED OVER TO NEW POC: 09/09/21    Time 8    Period Weeks    Status Not Met    Target Date --   NEW POC: 09/09/21     PT LONG TERM GOAL #2   Title Pt will demonstrate and  verbalize proper lifting technique and ways to perform household chores with proper technique to not incr. tension in pelvic floor.    Time 8    Period Weeks    Status Partially  Met      PT LONG TERM GOAL #3   Title Assess gait and write goal as indicated.    Time 8    Period Weeks    Status Achieved      PT LONG TERM GOAL #4   Title Pt will demonstrate proper engagement of deep core musculature to decr. diastasis recti to <2 fingers width to improve core stability and decr. back pain while caring for her child.    Baseline 3 fingers width 1" superior to umbilicus and 2.5 fingers width 1" inf. to umbilicus, 1/44/31: 2.5 fingers width 1" sup. to umbilicus and 2 fingers width 1" inf. to umbilicus.    Time 8    Period Weeks    Status Partially Met      PT LONG TERM GOAL #5   Title Pt will improve FOTO score (PFDI pain) to 40 to improve QOL.    Baseline 17    Time 4    Period Weeks    Status Deferred   will wait to assess at d/c                  Plan - 07/25/21 1209     Clinical Impression Statement Pt demonstrated progress as she was able to tolerate deeper pressure during internal pelvic floor manual therapy and progressed to coordinating PFM contraction and relaxation with breath. Pt also able to progress to advance glute med and deep core activities, but did require rest breaks in between sets 2/2 fatigue. Pt would continue to benefit from skilled PT to improve pain, posture, coordination, strength, and flexibility.    PT Treatment/Interventions ADLs/Self Care Home Management;Biofeedback;Moist Heat;Gait training;Stair training;Functional mobility training;Therapeutic activities;Neuromuscular re-education;Balance training;Patient/family education;Therapeutic exercise;Manual techniques;Joint Manipulations;Spinal Manipulations    PT Next Visit Plan Internal muscle assessment and manual therapy. Review HEP prn, hip piriformis stretch, deep core activation.    PT Home Exercise Plan [email or web address]    Consulted and Agree with Plan of Care Patient             Patient will benefit from skilled therapeutic intervention in order to improve the  following deficits and impairments:  Abnormal gait, Decreased balance, Decreased endurance, Hypomobility, Decreased coordination, Decreased strength, Impaired flexibility, Decreased range of motion  Visit Diagnosis: Other lack of coordination  Muscle weakness (generalized)  Sacrococcygeal disorders, not elsewhere classified  Dyspareunia, female     Problem List Patient Active Problem List   Diagnosis Date Noted   Back pain affecting pregnancy in third trimester 09/09/2020   Pelvic pain in pregnancy 05/30/2020   Vaginal bleeding in pregnancy, second trimester 05/30/2020   Supervision of normal pregnancy 02/16/2020   Insomnia 07/23/2019   GAD (generalized anxiety disorder) 12/30/2018   Palpitations 12/30/2018   Other premature beats 04/05/2010    Jenell Dobransky L, PT 07/25/2021, 12:12 PM  [place] [address] [place], [place], [address] Phone: [phone]   Fax:  [phone]  Name: Veronica [place] MRN: [id number] Date of Birth: [birth year]   Geoffry Paradise, PT,DPT 07/25/21 12:13 PM Phone: [phone] Fax: [phone]

## 2021-08-16 ENCOUNTER — Ambulatory Visit: Payer: Commercial Managed Care - PPO | Attending: Obstetrics and Gynecology

## 2021-08-16 ENCOUNTER — Other Ambulatory Visit: Payer: Self-pay

## 2021-08-16 DIAGNOSIS — M6281 Muscle weakness (generalized): Secondary | ICD-10-CM | POA: Insufficient documentation

## 2021-08-16 DIAGNOSIS — N941 Unspecified dyspareunia: Secondary | ICD-10-CM | POA: Insufficient documentation

## 2021-08-16 DIAGNOSIS — M533 Sacrococcygeal disorders, not elsewhere classified: Secondary | ICD-10-CM | POA: Insufficient documentation

## 2021-08-16 DIAGNOSIS — R278 Other lack of coordination: Secondary | ICD-10-CM | POA: Insufficient documentation

## 2021-08-16 NOTE — Patient Instructions (Signed)
Access Code: [email or web address] ?URL: [email or web address] ?Date: 08/16/2021 ?Prepared by: Zerita Boers ? ?Exercises ?Clamshell - 1 x daily - 3 x weekly - 3 sets - 10 reps WITH RED BAND ?Side Plank on Knees - 1 x daily - 3 x weekly - 1 sets - 3 reps - 25 hold ?Dead Bug - 1 x daily - 3 x weekly - 4 sets - 5 reps ?Standing Pelvic Tilt - 1 x daily - 7 x weekly - 1 sets - 10 reps ?Standard Plank - 1 x daily - 3 x weekly - 1 sets - 3 reps - 30 hold ?

## 2021-08-16 NOTE — Therapy (Signed)
[facility] ?[place], [place], [address] ?Phone: [phone]   Fax:  [phone] ? ?Physical Therapy Treatment ? ?Patient Details  ?Name: Veronica Wagner ?MRN: [id number] ?Date of Birth: 1990/10/08 ?Referring Provider (PT): Benjaman Kindler, MD ? ? ?Encounter Date: 08/16/2021 ? ? PT End of Session - 08/16/21 1128   ? ? Visit Number 11   ? Number of Visits 17   ? Date for PT Re-Evaluation 09/09/21   ? Authorization Type UHC   ? Progress Note Due on Visit 10   ? PT Start Time 1106   ? PT Stop Time 9833   ? PT Time Calculation (min) 58 min   ? Activity Tolerance Patient tolerated treatment well;No increased pain   ? Behavior During Therapy [facility] for tasks assessed/performed   ? ?  ?  ? ?  ? ? ?Past Medical History:  ?Diagnosis Date  ? Anxiety   ? Not on medication  ? Palpitations   ? ? ?Past Surgical History:  ?Procedure Laterality Date  ? BREAST SURGERY    ? Left breast aspiration.   ? CESAREAN SECTION  09/09/2020  ? Procedure: CESAREAN SECTION;  Surgeon: Benjaman Kindler, MD;  Location: [facility] ORS;  Service: Obstetrics;;  ? TONSILLECTOMY    ? ? ?There were no vitals filed for this visit. ? ? Subjective Assessment - 08/16/21 1109   ? ? Subjective Pt reported she was able to have intercourse with spouse, she was able to relax, perform breathing exercises, positive mindset, and completed intercourse. They went slow, as she had pain with initial penetration but it improved. They used aloe lubricant. She had to cancel appt. last week as she was out of town. Pt has been using dilators. Pt hasn't been back to gym since she was sick a few days ago.   ? Pertinent History Anxiety, hx of c-section with infection in 08/2020   ? Patient Stated Goals To be able to have sex with my husband.   ? Currently in Pain? No/denies   ? ?  ?  ? ?  ? ? ? ? ? ?NMR: ? ?Access Code: [email or web address] ?URL: [email or web address] ?Date: 08/16/2021 ?Prepared by: Geoffry Paradise ? ?Exercises ?Clamshell - 1 x daily - 3 x weekly - 3 sets - 10 reps WITH RED BAND ?Side Plank on Knees - 1 x daily - 3 x weekly - 1 sets - 3 reps - 25 hold ?Dead Bug - 1 x daily - 3 x weekly - 4 sets - 5 reps ?Standing Pelvic Tilt - 1 x daily - 7 x weekly - 1 sets - 10 reps ?Standard Plank - 1 x daily - 3 x weekly - 1 sets - 3 reps - 30 hold ?Cues and demo for proper technique. S for safety. ? ?PT assessed for diastasis recti: 1-1.5" finger width superior and inf. To umbilicus. 2.5 finger width at umbilicus. ?Pt noted to stand with incr. Ant. Pelvic tilt which improved with cues and demo.  ? ? ? ? ? ? ? ? ? ? ? ? ? ? ? ? ? ? ? ? ?SELF CARE: ?PT provided pt with more aloe vera lubricant samples and [employer]. ? PT Education - 08/16/21 1126   ? ? Education Details PT reviewed STG and LTG progress and asked if anything else needed to be address. Pt very excited about having intercourse and will attempt again. PT educated pt on hip muscle strength improvement. PT discussed  diastasis recti and implications.   ? Person(s) Educated Patient   ? Methods Explanation   ? Comprehension Verbalized understanding;Returned demonstration;Need further instruction   ? ?  ?  ? ?  ? ? ? PT Short Term Goals - 08/16/21 1116   ? ?  ? PT SHORT TERM GOAL #1  ? Title Pt will be IND with HEP to improve strength, posture, balance, flexibility and decr. pain.   ? Time 4   ? Period Weeks   ? Status Achieved   ? Target Date 06/06/21   ?  ? PT SHORT TERM GOAL #2  ? Title Pt will complete FOTO and PT will write goal as indicated.   ? Baseline PFDI pain: 17   ? Time 4   ? Period Weeks   ? Status Achieved   ? Target Date 06/06/21   ?  ? PT SHORT TERM GOAL #3  ? Title Pt will demonstrate improved posture (neutral pelvis and decr. lx spine lordosis) and strength of hip musculature over the last two weeks to decr. strain on pelvic floor musculature to reduce pelvic pain. ALL UNMET STGS CARRIED OVER TO NEW POC: 08/08/21   ? Baseline R  hip abd: 3+/5, L hip abd: 4/5, B hip add: 4/5 and incr. ant. pelvic tilt. 08/16/21: B hip abd/add: 5/5 and ant. pelvic tilt noted in standing but able to improve with cues.   ? Time 4   ? Period Weeks   ? Status Partially Met   ? Target Date 08/08/21   ?  ? PT SHORT TERM GOAL #4  ? Title Pt will verbalize techniques and strategies to improve intimacy with partner and decr. stress prior to OBGYN exam in order to reduce pain during exam and intercourse.   ? Time 4   ? Period Weeks   ? Status Achieved   ? Target Date 06/06/21   ?  ? PT SHORT TERM GOAL #5  ? Title Pt will report going to the gym three days a week without incr. in hip pain in order to maintain gains made during PT.   ? Baseline 08/16/21: took a break after illness but plans to go back to gym this week   ? Time 4   ? Period Weeks   ? Status Partially Met   ? Target Date 08/08/21   ? ?  ?  ? ?  ? ? ? ? PT Long Term Goals - 08/16/21 1125   ? ?  ? PT LONG TERM GOAL #1  ? Title Pt will demonstrate proper coordination of pelvic floor musculature to contract and relax in order to engage in intercourse and OBGYN exam without pain. ALL UNMET LTGS CARRIED OVER TO NEW POC: 09/09/21   ? Time 8   ? Period Weeks   ? Status On-going   ? Target Date --   NEW POC: 09/09/21  ?  ? PT LONG TERM GOAL #2  ? Title Pt will demonstrate and verbalize proper lifting technique and ways to perform household chores with proper technique to not incr. tension in pelvic floor.   ? Time 8   ? Period Weeks   ? Status On-going   ?  ? PT LONG TERM GOAL #3  ? Title Assess gait and write goal as indicated.   ? Time 8   ? Period Weeks   ? Status Achieved   ?  ? PT LONG TERM GOAL #4  ? Title Pt will demonstrate proper engagement  of deep core musculature to decr. diastasis recti to <2 fingers width to improve core stability and decr. back pain while caring for her child.   ? Baseline 3 fingers width 1" superior to umbilicus and 2.5 fingers width 1" inf. to umbilicus, 5/64/33: 2.5 fingers width 1"  sup. to umbilicus and 2 fingers width 1" inf. to umbilicus.   ? Time 8   ? Period Weeks   ? Status On-going   ?  ? PT LONG TERM GOAL #5  ? Title Pt will improve FOTO score (PFDI pain) to 40 to improve QOL.   ? Baseline 17   ? Time 4   ? Period Weeks   ? Status On-going   will wait to assess at d/c  ? ?  ?  ? ?  ? ? ? ? ? ? ? ? Plan - 08/16/21 1208   ? ? Clinical Impression Statement Pt demonstrated progress as her diastasis recti was only significant (> two fingers width) at umbilicus. Pt's abdominal strength and endurance improving as she was able to progress to standard plank vs. plank on knees and incr. time during R and L side planks on knees from 5 sec. to 25 sec. Pt also reported she was able to have complete intercourse with husband with minimal pain. Pt noted to stand with incr. ant. pelvic tilit which she was able to correct with cues. Pt's B hip strength (abd/add) improved to 5/5. Therefore, pt partially met STG (posture and hip strength). Pt is making progress towards LTGs and PT will continue to focus on pelvic floor muscle (PFM) coordination to reduce pain, improving hip/ab strength and posture to reduce pressure on PFM which can incr. tension.   ? PT Treatment/Interventions ADLs/Self Care Home Management;Biofeedback;Moist Heat;Gait training;Stair training;Functional mobility training;Therapeutic activities;Neuromuscular re-education;Balance training;Patient/family education;Therapeutic exercise;Manual techniques;Joint Manipulations;Spinal Manipulations   ? PT Next Visit Plan Internal muscle assessment and manual therapy. Review HEP prn, hip piriformis stretch, deep core activation.   ? PT Home Exercise Plan [email or web address]   ? Consulted and Agree with Plan of Care Patient   ? ?  ?  ? ?  ? ? ?Patient will benefit from skilled therapeutic intervention in order to improve the following deficits and impairments:  Abnormal gait, Decreased balance, Decreased endurance, Hypomobility, Decreased  coordination, Decreased strength, Impaired flexibility, Decreased range of motion ? ?Visit Diagnosis: ?Sacrococcygeal disorders, not elsewhere classified ? ?Other lack of coordination ? ?Muscle weakness (generalized) ? ?Dyspareunia

## 2021-08-23 ENCOUNTER — Ambulatory Visit: Payer: Commercial Managed Care - PPO

## 2021-08-23 ENCOUNTER — Other Ambulatory Visit: Payer: Self-pay

## 2021-08-23 DIAGNOSIS — R278 Other lack of coordination: Secondary | ICD-10-CM

## 2021-08-23 DIAGNOSIS — N941 Unspecified dyspareunia: Secondary | ICD-10-CM

## 2021-08-23 DIAGNOSIS — M533 Sacrococcygeal disorders, not elsewhere classified: Secondary | ICD-10-CM

## 2021-08-23 DIAGNOSIS — M6281 Muscle weakness (generalized): Secondary | ICD-10-CM

## 2021-08-23 NOTE — Therapy (Signed)
[facility] ?[place], [place], [address] ?Phone: [phone]   Fax:  [phone] ? ?Physical Therapy Treatment ? ?Patient Details  ?Name: Veronica Wagner ?MRN: [id number] ?Date of Birth: Oct 01, 1990 ?Referring Provider (PT): Benjaman Kindler, MD ? ? ?Encounter Date: 08/23/2021 ? ? PT End of Session - 08/23/21 1203   ? ? Visit Number 12   ? Number of Visits 17   ? Date for PT Re-Evaluation 09/09/21   ? Authorization Type UHC   ? Progress Note Due on Visit 10   ? PT Start Time 1103   ? PT Stop Time 3220   ? PT Time Calculation (min) 54 min   ? Activity Tolerance Patient tolerated treatment well;No increased pain   ? Behavior During Therapy [facility] for tasks assessed/performed   ? ?  ?  ? ?  ? ? ?Past Medical History:  ?Diagnosis Date  ? Anxiety   ? Not on medication  ? Palpitations   ? ? ?Past Surgical History:  ?Procedure Laterality Date  ? BREAST SURGERY    ? Left breast aspiration.   ? CESAREAN SECTION  09/09/2020  ? Procedure: CESAREAN SECTION;  Surgeon: Benjaman Kindler, MD;  Location: [facility] ORS;  Service: Obstetrics;;  ? TONSILLECTOMY    ? ? ?There were no vitals filed for this visit. ? ? Subjective Assessment - 08/23/21 1107   ? ? Subjective Pt reported she's been doing well this week. Pt went back to the gym and it went well, no pain. Pt had intercourse again last week and reported it was good, no pain. Pt is using dilators prior to intercourse.   ? Pertinent History Anxiety, hx of c-section with infection in 08/2020   ? Patient Stated Goals To be able to have sex with my husband.   ? Currently in Pain? No/denies   ? ?  ?  ? ?  ? ? ? ? ? ? ? ?NMR: ?Access Code: [email or web address] ?URL: [email or web address] ?Date: 08/23/2021 ?Prepared by: Geoffry Paradise ? ?Exercises ?- - Dead Bug  - 1 x daily - 3 x weekly - 4 sets - 5 reps (with and without knees extended and then performed with phyioball in [place] with hip flexed to 90 degrees and knees extended and  then slightly flexed but pt had difficulty engaging core. ?- Standard Plank  - 1 x daily - 3 x weekly - 1 sets - 3 reps - 30 hold ?- Supine Dead Bug with Leg Extension  - 1 x daily - 3 x weekly - 4 sets - 5 reps ?- Reverse Crunch  - 1 x daily - 3 x weekly - 1 sets - 3 reps for 30 sec. Vs. reps ?- Single Leg Bridge on [employer]  - 1 x daily - 3 x weekly - 2 sets - 10 reps ?- Side Plank on Elbow  - 1 x daily - 3 x weekly - 1 sets - 3 reps - 15 hold ? ?Performed with S for safety. Cues and demo for technique-especially to keep core engaged. Rest break 2/2 fatigue.  ? ? ? ? ? ? ? ? ? ? ? ? ? ? ? ? ? ? ? ? ? PT Education - 08/23/21 1202   ? ? Education Details PT educated pt on progressing HEP and reducing visits 2/2 progress. Potential d/c next session will be based off progress.   ? Person(s) Educated Patient   ? Methods Explanation;Demonstration;Tactile cues;Verbal cues;Handout   ?  Comprehension Verbalized understanding;Returned demonstration;Need further instruction   ? ?  ?  ? ?  ? ? ? PT Short Term Goals - 08/16/21 1116   ? ?  ? PT SHORT TERM GOAL #1  ? Title Pt will be IND with HEP to improve strength, posture, balance, flexibility and decr. pain.   ? Time 4   ? Period Weeks   ? Status Achieved   ? Target Date 06/06/21   ?  ? PT SHORT TERM GOAL #2  ? Title Pt will complete FOTO and PT will write goal as indicated.   ? Baseline PFDI pain: 17   ? Time 4   ? Period Weeks   ? Status Achieved   ? Target Date 06/06/21   ?  ? PT SHORT TERM GOAL #3  ? Title Pt will demonstrate improved posture (neutral pelvis and decr. lx spine lordosis) and strength of hip musculature over the last two weeks to decr. strain on pelvic floor musculature to reduce pelvic pain. ALL UNMET STGS CARRIED OVER TO NEW POC: 08/08/21   ? Baseline R hip abd: 3+/5, L hip abd: 4/5, B hip add: 4/5 and incr. ant. pelvic tilt. 08/16/21: B hip abd/add: 5/5 and ant. pelvic tilt noted in standing but able to improve with cues.   ? Time 4   ? Period Weeks   ?  Status Partially Met   ? Target Date 08/08/21   ?  ? PT SHORT TERM GOAL #4  ? Title Pt will verbalize techniques and strategies to improve intimacy with partner and decr. stress prior to OBGYN exam in order to reduce pain during exam and intercourse.   ? Time 4   ? Period Weeks   ? Status Achieved   ? Target Date 06/06/21   ?  ? PT SHORT TERM GOAL #5  ? Title Pt will report going to the gym three days a week without incr. in hip pain in order to maintain gains made during PT.   ? Baseline 08/16/21: took a break after illness but plans to go back to gym this week   ? Time 4   ? Period Weeks   ? Status Partially Met   ? Target Date 08/08/21   ? ?  ?  ? ?  ? ? ? ? PT Long Term Goals - 08/16/21 1125   ? ?  ? PT LONG TERM GOAL #1  ? Title Pt will demonstrate proper coordination of pelvic floor musculature to contract and relax in order to engage in intercourse and OBGYN exam without pain. ALL UNMET LTGS CARRIED OVER TO NEW POC: 09/09/21   ? Time 8   ? Period Weeks   ? Status On-going   ? Target Date --   NEW POC: 09/09/21  ?  ? PT LONG TERM GOAL #2  ? Title Pt will demonstrate and verbalize proper lifting technique and ways to perform household chores with proper technique to not incr. tension in pelvic floor.   ? Time 8   ? Period Weeks   ? Status On-going   ?  ? PT LONG TERM GOAL #3  ? Title Assess gait and write goal as indicated.   ? Time 8   ? Period Weeks   ? Status Achieved   ?  ? PT LONG TERM GOAL #4  ? Title Pt will demonstrate proper engagement of deep core musculature to decr. diastasis recti to <2 fingers width to improve core stability and decr.  back pain while caring for her child.   ? Baseline 3 fingers width 1" superior to umbilicus and 2.5 fingers width 1" inf. to umbilicus, 0/94/07: 2.5 fingers width 1" sup. to umbilicus and 2 fingers width 1" inf. to umbilicus.   ? Time 8   ? Period Weeks   ? Status On-going   ?  ? PT LONG TERM GOAL #5  ? Title Pt will improve FOTO score (PFDI pain) to 40 to improve QOL.    ? Baseline 17   ? Time 4   ? Period Weeks   ? Status On-going   will wait to assess at d/c  ? ?  ?  ? ?  ? ? ? ? ? ? ? ? Plan - 08/23/21 1204   ? ? Clinical Impression Statement Pt continues to demonstrate progress as she was able to engage in intercourse with no pain with deep penetration and only mild pain with initial penetration. Pt continues to require cues to keep TrA engaged during strengthening activities. Pt demonstrated improved strength and endurance as she was able to progress core and LE strengthening activities. Pt would continue to benefit from skilled PT to improve coordination of pelvic floor muscles, strength, and reduce pain and pressure.   ? PT Treatment/Interventions ADLs/Self Care Home Management;Biofeedback;Moist Heat;Gait training;Stair training;Functional mobility training;Therapeutic activities;Neuromuscular re-education;Balance training;Patient/family education;Therapeutic exercise;Manual techniques;Joint Manipulations;Spinal Manipulations   ? PT Next Visit Plan Internal muscle assessment and manual therapy. review LTGs, potential hold or d/c.   ? PT Home Exercise Plan [email or web address]   ? Consulted and Agree with Plan of Care Patient   ? ?  ?  ? ?  ? ? ?Patient will benefit from skilled therapeutic intervention in order to improve the following deficits and impairments:  Abnormal gait, Decreased balance, Decreased endurance, Hypomobility, Decreased coordination, Decreased strength, Impaired flexibility, Decreased range of motion ? ?Visit Diagnosis: ?Muscle weakness (generalized) ? ?Dyspareunia, female ? ?Other lack of coordination ? ?Sacrococcygeal disorders, not elsewhere classified ? ? ? ? ?Problem List ?Patient Active Problem List  ? Diagnosis Date Noted  ? Back pain affecting pregnancy in third trimester 09/09/2020  ? Pelvic pain in pregnancy 05/30/2020  ? Vaginal bleeding in pregnancy, second trimester 05/30/2020  ? Supervision of normal pregnancy 02/16/2020  ? Insomnia 07/23/2019   ? GAD (generalized anxiety disorder) 12/30/2018  ? Palpitations 12/30/2018  ? Other premature beats 04/05/2010  ? ? ?Haide Klinker L, PT ?08/23/2021, 12:06 PM ? ?[place] ?[place]

## 2021-09-06 ENCOUNTER — Ambulatory Visit: Payer: Commercial Managed Care - PPO | Attending: Obstetrics and Gynecology

## 2021-09-06 ENCOUNTER — Other Ambulatory Visit: Payer: Self-pay

## 2021-09-06 DIAGNOSIS — N941 Unspecified dyspareunia: Secondary | ICD-10-CM | POA: Insufficient documentation

## 2021-09-06 DIAGNOSIS — R278 Other lack of coordination: Secondary | ICD-10-CM | POA: Diagnosis present

## 2021-09-06 DIAGNOSIS — M6281 Muscle weakness (generalized): Secondary | ICD-10-CM | POA: Insufficient documentation

## 2021-09-06 DIAGNOSIS — M533 Sacrococcygeal disorders, not elsewhere classified: Secondary | ICD-10-CM | POA: Insufficient documentation

## 2021-09-06 NOTE — Therapy (Addendum)
[facility] ?[place], [place], [address] ?Phone: [phone]   Fax:  [phone] ? ?Physical Therapy Treatment/discharge ? ?Patient Details  ?Name: Veronica Wagner ?MRN: [id number] ?Date of Birth: Jul 04, 1990 ?Referring Provider (PT): Benjaman Kindler, MD ? ? ?Encounter Date: 09/06/2021 ? ? PT End of Session - 09/06/21 1146   ? ? Visit Number 13   ? Number of Visits 17   ? Date for PT Re-Evaluation 09/09/21   ? Authorization Type [employer]   ? Progress Note Due on Visit 10   ? PT Start Time 1101   ? PT Stop Time 1025   but pt in clinic until approx. 1140 discussing life circumstances  ? PT Time Calculation (min) 25 min   ? Activity Tolerance Patient tolerated treatment well;No increased pain   ? Behavior During Therapy [facility] for tasks assessed/performed   ? ?  ?  ? ?  ? ? ?Past Medical History:  ?Diagnosis Date  ? Anxiety   ? Not on medication  ? Palpitations   ? ? ?Past Surgical History:  ?Procedure Laterality Date  ? BREAST SURGERY    ? Left breast aspiration.   ? CESAREAN SECTION  09/09/2020  ? Procedure: CESAREAN SECTION;  Surgeon: Benjaman Kindler, MD;  Location: [facility] ORS;  Service: Obstetrics;;  ? TONSILLECTOMY    ? ? ?There were no vitals filed for this visit. ? ? Subjective Assessment - 09/06/21 1104   ? ? Subjective Pt reported she's been doing well. Pt hasn't been going to the gym as she's been busy but has been performing HEP. Pt has not had intercourse due to schedule. Pt reported 90-95% better on GROC scale.   ? Pertinent History Anxiety, hx of c-section with infection in 08/2020   ? Patient Stated Goals To be able to have sex with my husband.   ? Currently in Pain? No/denies   ? ?  ?  ? ?  ? ? ? ? ? ? ? ? ? ? ? ?Self care: pt demonstrated and verbalized proper lifting technique, posture, and core activation during household chores, holding her baby and ADLs. ?Diastasis recti assessed with pt in supine and less than 1 finger width noted above and below  umbilicus. ? ? ? ? ? ? ? ? ? ? ? ? ? ? ? ?Self care: ? PT Education - 09/06/21 1145   ? ? Education Details PT discussed goal progress and d/c. Pt agreeable. PT reiterated the importance of continuing HEP and going to the gym to maintain gains made during PT.   ? Person(s) Educated Patient   ? Methods Explanation   ? ?  ?  ? ?  ? ? ? PT Short Term Goals - 08/16/21 1116   ? ?  ? PT SHORT TERM GOAL #1  ? Title Pt will be IND with HEP to improve strength, posture, balance, flexibility and decr. pain.   ? Time 4   ? Period Weeks   ? Status Achieved   ? Target Date 06/06/21   ?  ? PT SHORT TERM GOAL #2  ? Title Pt will complete FOTO and PT will write goal as indicated.   ? Baseline PFDI pain: 17   ? Time 4   ? Period Weeks   ? Status Achieved   ? Target Date 06/06/21   ?  ? PT SHORT TERM GOAL #3  ? Title Pt will demonstrate improved posture (neutral pelvis and decr. lx  spine lordosis) and strength of hip musculature over the last two weeks to decr. strain on pelvic floor musculature to reduce pelvic pain. ALL UNMET STGS CARRIED OVER TO NEW POC: 08/08/21   ? Baseline R hip abd: 3+/5, L hip abd: 4/5, B hip add: 4/5 and incr. ant. pelvic tilt. 08/16/21: B hip abd/add: 5/5 and ant. pelvic tilt noted in standing but able to improve with cues.   ? Time 4   ? Period Weeks   ? Status Partially Met   ? Target Date 08/08/21   ?  ? PT SHORT TERM GOAL #4  ? Title Pt will verbalize techniques and strategies to improve intimacy with partner and decr. stress prior to OBGYN exam in order to reduce pain during exam and intercourse.   ? Time 4   ? Period Weeks   ? Status Achieved   ? Target Date 06/06/21   ?  ? PT SHORT TERM GOAL #5  ? Title Pt will report going to the gym three days a week without incr. in hip pain in order to maintain gains made during PT.   ? Baseline 08/16/21: took a break after illness but plans to go back to gym this week   ? Time 4   ? Period Weeks   ? Status Partially Met   ? Target Date 08/08/21   ? ?  ?  ? ?   ? ? ? ? PT Long Term Goals - 09/06/21 1109   ? ?  ? PT LONG TERM GOAL #1  ? Title Pt will demonstrate proper coordination of pelvic floor musculature to contract and relax in order to engage in intercourse and OBGYN exam without pain. ALL UNMET LTGS CARRIED OVER TO NEW POC: 09/09/21   ? Baseline Pt has had intercourse twice without pain.   ? Time 8   ? Period Weeks   ? Status Achieved   ? Target Date --   NEW POC: 09/09/21  ?  ? PT LONG TERM GOAL #2  ? Title Pt will demonstrate and verbalize proper lifting technique and ways to perform household chores with proper technique to not incr. tension in pelvic floor.   ? Baseline pt able to verbalize and demo proper technique.   ? Time 8   ? Period Weeks   ? Status Achieved   ?  ? PT LONG TERM GOAL #3  ? Title Assess gait and write goal as indicated.   ? Baseline no LTG needed.   ? Time 8   ? Period Weeks   ? Status Achieved   ?  ? PT LONG TERM GOAL #4  ? Title Pt will demonstrate proper engagement of deep core musculature to decr. diastasis recti to <2 fingers width to improve core stability and decr. back pain while caring for her child.   ? Baseline 3 fingers width 1" superior to umbilicus and 2.5 fingers width 1" inf. to umbilicus, 09/13/38: 2.5 fingers width 1" sup. to umbilicus and 2 fingers width 1" inf. to umbilicus. 09/06/21: less than 1 fingers width above and below umbilicus.   ? Time 8   ? Period Weeks   ? Status Achieved   ?  ? PT LONG TERM GOAL #5  ? Title Pt will improve FOTO score (PFDI pain) to 7 to improve QOL.   ? Baseline 17-changed goal, as scores closer to 0 indicate better function on PFDI pain questionnaire). 09/06/21: 0   ? Time 4   ? Period  Weeks   ? Status Achieved   will wait to assess at d/c  ? ?  ?  ? ?  ? ? ? ? ? ? ? ? Plan - 09/06/21 1146   ? ? Clinical Impression Statement Pt demonstrated progress as all LTGs met, therfore pt discharged today. Please see d/c summary for details.   ? PT Treatment/Interventions ADLs/Self Care Home  Management;Biofeedback;Moist Heat;Gait training;Stair training;Functional mobility training;Therapeutic activities;Neuromuscular re-education;Balance training;Patient/family education;Therapeutic exercise;Manual techniques;Joint Manipulations;Spinal Manipulations   ? PT Next Visit Plan d/c   ? PT Home Exercise Plan [id number]   ? Consulted and Agree with Plan of Care Patient   ? ?  ?  ? ?  ? ? ?Patient will benefit from skilled therapeutic intervention in order to improve the following deficits and impairments:  Abnormal gait, Decreased balance, Decreased endurance, Hypomobility, Decreased coordination, Decreased strength, Impaired flexibility, Decreased range of motion ? ?Visit Diagnosis: ?Muscle weakness (generalized) ? ?Other lack of coordination ? ?Dyspareunia, female ? ?Sacrococcygeal disorders, not elsewhere classified ? ? ? ? ?Problem List ?Patient Active Problem List  ? Diagnosis Date Noted  ? Back pain affecting pregnancy in third trimester 09/09/2020  ? Pelvic pain in pregnancy 05/30/2020  ? Vaginal bleeding in pregnancy, second trimester 05/30/2020  ? Supervision of normal pregnancy 02/16/2020  ? Insomnia 07/23/2019  ? GAD (generalized anxiety disorder) 12/30/2018  ? Palpitations 12/30/2018  ? Other premature beats 04/05/2010  ? ? ?Zayla Agar L, PT ?09/06/2021, 11:47 AM ? ?[facility] ?[place], [place], [address] ?Phone: [phone]   Fax:  [phone] ? ?Name: Toria Monte ?MRN: [id number] ?Date of Birth: 11/17/1990 ? ? ?PHYSICAL THERAPY DISCHARGE SUMMARY ? ?Visits from Start of Care: 13 ? ?Current functional level related to goals / functional outcomes: ? PT Long Term Goals - 09/06/21 1109   ? ?  ? PT LONG TERM GOAL #1  ? Title Pt will demonstrate proper coordination of pelvic floor musculature to contract and relax in order to engage in intercourse and OBGYN exam without pain. ALL UNMET LTGS CARRIED OVER TO NEW POC: 09/09/21    ? Baseline Pt has had intercourse twice without pain.   ? Time 8   ? Period Weeks   ? Status Achieved   ? Target Date --   NEW POC: 09/09/21  ?  ? PT LONG TERM GOAL #2  ? Title Pt will demonstrate and verbalize proper
